# Patient Record
Sex: Male | Born: 1950 | Race: White | Hispanic: No | Marital: Married | Smoking: Never smoker
Health system: Southern US, Community
[De-identification: ages and names within clinical notes are randomized; demographics above are authoritative.]

## PROBLEM LIST (undated history)

## (undated) DIAGNOSIS — A4902 Methicillin resistant Staphylococcus aureus infection, unspecified site: Secondary | ICD-10-CM

## (undated) DIAGNOSIS — G4730 Sleep apnea, unspecified: Secondary | ICD-10-CM

## (undated) DIAGNOSIS — R112 Nausea with vomiting, unspecified: Secondary | ICD-10-CM

## (undated) DIAGNOSIS — E785 Hyperlipidemia, unspecified: Secondary | ICD-10-CM

## (undated) DIAGNOSIS — Z9889 Other specified postprocedural states: Secondary | ICD-10-CM

## (undated) DIAGNOSIS — D759 Disease of blood and blood-forming organs, unspecified: Secondary | ICD-10-CM

## (undated) DIAGNOSIS — I10 Essential (primary) hypertension: Secondary | ICD-10-CM

## (undated) DIAGNOSIS — E119 Type 2 diabetes mellitus without complications: Secondary | ICD-10-CM

## (undated) DIAGNOSIS — G90529 Complex regional pain syndrome I of unspecified lower limb: Secondary | ICD-10-CM

## (undated) DIAGNOSIS — M1990 Unspecified osteoarthritis, unspecified site: Secondary | ICD-10-CM

## (undated) DIAGNOSIS — G459 Transient cerebral ischemic attack, unspecified: Secondary | ICD-10-CM

## (undated) DIAGNOSIS — T8859XA Other complications of anesthesia, initial encounter: Secondary | ICD-10-CM

## (undated) DIAGNOSIS — T4145XA Adverse effect of unspecified anesthetic, initial encounter: Secondary | ICD-10-CM

## (undated) HISTORY — PX: BACK SURGERY: SHX140

## (undated) HISTORY — PX: CARPAL TUNNEL RELEASE: SHX101

## (undated) HISTORY — PX: CHOLECYSTECTOMY: SHX55

## (undated) HISTORY — PX: EYE SURGERY: SHX253

## (undated) HISTORY — PX: OTHER SURGICAL HISTORY: SHX169

## (undated) HISTORY — PX: NECK SURGERY: SHX720

---

## 2014-01-22 ENCOUNTER — Emergency Department (HOSPITAL_COMMUNITY): Payer: Medicare HMO

## 2014-01-22 ENCOUNTER — Emergency Department (HOSPITAL_COMMUNITY)
Admission: EM | Admit: 2014-01-22 | Discharge: 2014-01-22 | Disposition: A | Discharge status: Home / Self Care | Payer: Medicare HMO | Attending: Emergency Medicine | Admitting: Emergency Medicine

## 2014-01-22 ENCOUNTER — Encounter (HOSPITAL_COMMUNITY): Payer: Self-pay | Admitting: Emergency Medicine

## 2014-01-22 DIAGNOSIS — J069 Acute upper respiratory infection, unspecified: Secondary | ICD-10-CM

## 2014-01-22 DIAGNOSIS — R51 Headache: Secondary | ICD-10-CM | POA: Insufficient documentation

## 2014-01-22 DIAGNOSIS — R630 Anorexia: Secondary | ICD-10-CM | POA: Insufficient documentation

## 2014-01-22 DIAGNOSIS — E119 Type 2 diabetes mellitus without complications: Secondary | ICD-10-CM | POA: Insufficient documentation

## 2014-01-22 DIAGNOSIS — I10 Essential (primary) hypertension: Secondary | ICD-10-CM | POA: Insufficient documentation

## 2014-01-22 DIAGNOSIS — Z8614 Personal history of Methicillin resistant Staphylococcus aureus infection: Secondary | ICD-10-CM | POA: Insufficient documentation

## 2014-01-22 DIAGNOSIS — M549 Dorsalgia, unspecified: Secondary | ICD-10-CM | POA: Insufficient documentation

## 2014-01-22 DIAGNOSIS — J029 Acute pharyngitis, unspecified: Secondary | ICD-10-CM | POA: Insufficient documentation

## 2014-01-22 HISTORY — DX: Hyperlipidemia, unspecified: E78.5

## 2014-01-22 HISTORY — DX: Type 2 diabetes mellitus without complications: E11.9

## 2014-01-22 HISTORY — DX: Methicillin resistant Staphylococcus aureus infection, unspecified site: A49.02

## 2014-01-22 HISTORY — DX: Essential (primary) hypertension: I10

## 2014-01-22 MED ORDER — HYDROCOD POLST-CHLORPHEN POLST 10-8 MG/5ML PO LQCR: 5.0000 mL | Freq: Two times a day (BID) | ORAL | Status: DC | PRN

## 2014-01-22 MED ORDER — PREDNISONE 10 MG PO TABS: ORAL_TABLET | ORAL | Status: DC

## 2014-01-22 MED ORDER — OSELTAMIVIR PHOSPHATE 75 MG PO CAPS: 75.0000 mg | ORAL_CAPSULE | Freq: Two times a day (BID) | ORAL | Status: DC

## 2014-01-22 NOTE — Discharge Instructions (Signed)
Your chest x-ray is negative for any acute problems. Your examination and history are consistent with upper respiratory infection. This may be a derivative of influenza. Please wash hands frequently. Please increase fluids. Please use your mask until symptoms have resolved. Please use Tamiflu 2 times daily and prednisone taper as directed until all taken. Please use Tussionex for cough. This medication may cause drowsiness, please use with caution. Please return if any unusual fever, coughing up blood, difficulty breathing, or deterioration in your condition. Upper Respiratory Infection, Adult An upper respiratory infection (URI) is also sometimes known as the common cold. The upper respiratory tract includes the nose, sinuses, throat, trachea, and bronchi. Bronchi are the airways leading to the lungs. Most people improve within 1 week, but symptoms can last up to 2 weeks. A residual cough may last even longer.  CAUSES Many different viruses can infect the tissues lining the upper respiratory tract. The tissues become irritated and inflamed and often become very moist. Mucus production is also common. A cold is contagious. You can easily spread the virus to others by oral contact. This includes kissing, sharing a glass, coughing, or sneezing. Touching your mouth or nose and then touching a surface, which is then touched by another person, can also spread the virus. SYMPTOMS  Symptoms typically develop 1 to 3 days after you come in contact with a cold virus. Symptoms vary from person to person. They may include:  Runny nose.  Sneezing.  Nasal congestion.  Sinus irritation.  Sore throat.  Loss of voice (laryngitis).  Cough.  Fatigue.  Muscle aches.  Loss of appetite.  Headache.  Low-grade fever. DIAGNOSIS  You might diagnose your own cold based on familiar symptoms, since most people get a cold 2 to 3 times a year. Your caregiver can confirm this based on your exam. Most importantly,  your caregiver can check that your symptoms are not due to another disease such as strep throat, sinusitis, pneumonia, asthma, or epiglottitis. Blood tests, throat tests, and X-rays are not necessary to diagnose a common cold, but they may sometimes be helpful in excluding other more serious diseases. Your caregiver will decide if any further tests are required. RISKS AND COMPLICATIONS  You may be at risk for a more severe case of the common cold if you smoke cigarettes, have chronic heart disease (such as heart failure) or lung disease (such as asthma), or if you have a weakened immune system. The very young and very old are also at risk for more serious infections. Bacterial sinusitis, middle ear infections, and bacterial pneumonia can complicate the common cold. The common cold can worsen asthma and chronic obstructive pulmonary disease (COPD). Sometimes, these complications can require emergency medical care and may be life-threatening. PREVENTION  The best way to protect against getting a cold is to practice good hygiene. Avoid oral or hand contact with people with cold symptoms. Wash your hands often if contact occurs. There is no clear evidence that vitamin C, vitamin E, echinacea, or exercise reduces the chance of developing a cold. However, it is always recommended to get plenty of rest and practice good nutrition. TREATMENT  Treatment is directed at relieving symptoms. There is no cure. Antibiotics are not effective, because the infection is caused by a virus, not by bacteria. Treatment may include:  Increased fluid intake. Sports drinks offer valuable electrolytes, sugars, and fluids.  Breathing heated mist or steam (vaporizer or shower).  Eating chicken soup or other clear broths, and maintaining good nutrition.  Getting plenty of rest.  Using gargles or lozenges for comfort.  Controlling fevers with ibuprofen or acetaminophen as directed by your caregiver.  Increasing usage of your  inhaler if you have asthma. Zinc gel and zinc lozenges, taken in the first 24 hours of the common cold, can shorten the duration and lessen the severity of symptoms. Pain medicines may help with fever, muscle aches, and throat pain. A variety of non-prescription medicines are available to treat congestion and runny nose. Your caregiver can make recommendations and may suggest nasal or lung inhalers for other symptoms.  HOME CARE INSTRUCTIONS   Only take over-the-counter or prescription medicines for pain, discomfort, or fever as directed by your caregiver.  Use a warm mist humidifier or inhale steam from a shower to increase air moisture. This may keep secretions moist and make it easier to breathe.  Drink enough water and fluids to keep your urine clear or pale yellow.  Rest as needed.  Return to work when your temperature has returned to normal or as your caregiver advises. You may need to stay home longer to avoid infecting others. You can also use a face mask and careful hand washing to prevent spread of the virus. SEEK MEDICAL CARE IF:   After the first few days, you feel you are getting worse rather than better.  You need your caregiver's advice about medicines to control symptoms.  You develop chills, worsening shortness of breath, or brown or red sputum. These may be signs of pneumonia.  You develop yellow or brown nasal discharge or pain in the face, especially when you bend forward. These may be signs of sinusitis.  You develop a fever, swollen neck glands, pain with swallowing, or white areas in the back of your throat. These may be signs of strep throat. SEEK IMMEDIATE MEDICAL CARE IF:   You have a fever.  You develop severe or persistent headache, ear pain, sinus pain, or chest pain.  You develop wheezing, a prolonged cough, cough up blood, or have a change in your usual mucus (if you have chronic lung disease).  You develop sore muscles or a stiff neck. Document  Released: 06/08/2001 Document Revised: 03/06/2012 Document Reviewed: 04/16/2011 [facility] Patient Information 2014 [place], [place].

## 2014-01-22 NOTE — ED Provider Notes (Signed)
CSN: [id number]     Arrival date & time 01/22/14  0749 History   First MD Initiated Contact with Patient 01/22/14 [phone]     Chief Complaint  Patient presents with  . flu sx    (Consider location/radiation/quality/duration/timing/severity/associated sxs/prior Treatment) Patient is a 63 y.o. male presenting with flu symptoms. The history is provided by the patient.  Influenza Presenting symptoms: cough, headache, rhinorrhea and sore throat   Presenting symptoms: no diarrhea, no nausea, no shortness of breath and no vomiting   Severity:  Moderate Onset quality:  Sudden Duration:  1 day Progression:  Worsening Chronicity:  New Relieved by:  Nothing Ineffective treatments:  Certain positions and OTC medications Associated symptoms: decreased appetite and decreased physical activity   Associated symptoms: no chills   Risk factors: diabetes     Past Medical History  Diagnosis Date  . Diabetes mellitus without complication   . Hypertension   . Hyperlipidemia   . MRSA (methicillin resistant Staphylococcus aureus)    Past Surgical History  Procedure Laterality Date  . Back surgery    . Right shoulder sugery      3 surgeries and shoulder replacement  . Carpal tunnel release Right   . Left middle finger surgery    . Left pinky surgery    . Neck surgery    . Cholecystectomy    . Eye surgery     History reviewed. No pertinent family history. History  Substance Use Topics  . Smoking status: Never Smoker   . Smokeless tobacco: Not on file  . Alcohol Use: Yes    Review of Systems  Constitutional: Positive for decreased appetite. Negative for chills and activity change.       All ROS Neg except as noted in HPI  HENT: Positive for rhinorrhea and sore throat. Negative for nosebleeds.   Eyes: Negative for photophobia and discharge.  Respiratory: Positive for cough. Negative for shortness of breath and wheezing.   Cardiovascular: Negative for chest pain and palpitations.   Gastrointestinal: Negative for nausea, vomiting, abdominal pain, diarrhea and blood in stool.  Genitourinary: Negative for dysuria, frequency and hematuria.  Musculoskeletal: Positive for back pain. Negative for arthralgias and neck pain.  Skin: Negative.   Neurological: Positive for headaches. Negative for dizziness, seizures and speech difficulty.  Psychiatric/Behavioral: Negative for hallucinations and confusion.    Allergies  Review of patient's allergies indicates not on file.  Home Medications  No current outpatient prescriptions on file. BP 120/77  Pulse 89  Temp(Src) 98.5 F (36.9 C) (Oral)  Resp 16  Ht 5\' 11"  (1.803 m)  Wt 265 lb (120.203 kg)  BMI 36.98 kg/m2  SpO2 94% Physical Exam  Nursing note and vitals reviewed. Constitutional: He is oriented to person, place, and time. He appears well-developed and well-nourished.  Non-toxic appearance.  HENT:  Head: Normocephalic.  Right Ear: Tympanic membrane and external ear normal.  Left Ear: Tympanic membrane and external ear normal.  Nasal congestion  Eyes: EOM and lids are normal. Pupils are equal, round, and reactive to light.  Neck: Normal range of motion. Neck supple. Carotid bruit is not present.  Cardiovascular: Normal rate, regular rhythm, normal heart sounds, intact distal pulses and normal pulses.   Pulmonary/Chest: Breath sounds normal. No respiratory distress.  Pt speaks in complete sentences. Symmetrical rise and fall of the chest. Rhonchi present. No wheezes.  Abdominal: Soft. Bowel sounds are normal. There is no tenderness. There is no guarding.  Musculoskeletal: Normal range of motion.  Lymphadenopathy:  Head (right side): No submandibular adenopathy present.       Head (left side): No submandibular adenopathy present.    He has no cervical adenopathy.  Neurological: He is alert and oriented to person, place, and time. He has normal strength. No cranial nerve deficit or sensory deficit. He exhibits  normal muscle tone. Coordination normal.  Skin: Skin is warm and dry.  Psychiatric: He has a normal mood and affect. His speech is normal.    ED Course  Procedures (including critical care time) Labs Review Labs Reviewed - No data to display Imaging Review No results found.  EKG Interpretation   None       MDM  No diagnosis found. *I have reviewed nursing notes, vital signs, and all appropriate lab and imaging results for this patient.**  Vital signs are well within normal limits. Pulse oximetry is 94% on room air. Within normal limits by my interpretation. Chest x-ray is negative for pneumonia or other acute changes. Suspect that the patient has an upper respiratory infection, and or derivative of influenza.  Plan at this time is for the patient to be treated with Tamiflu, prednisone taper, and Tussionex. I have discussed with patient importance of monitoring his diabetes closely while on the prednisone. Also discussed with the patient the need to return if any unusual high fever, hemoptysis, difficulty breathing, or deterioration in his condition.  Kathie DikeHobson M Kaedyn Belardo, PA-C 01/22/14 [phone]

## 2014-01-22 NOTE — ED Notes (Signed)
Pt. Reports cough, congestion, and generalized weakness starting last night. Pt. Denies nausea/vomiting/diarrhea.

## 2014-01-23 NOTE — ED Provider Notes (Signed)
Medical screening examination/treatment/procedure(s) were performed by non-physician practitioner and as supervising physician I was immediately available for consultation/collaboration.  EKG Interpretation   None         Laray AngerKathleen M Pariss Hommes, DO 01/23/14 1737

## 2014-02-18 ENCOUNTER — Encounter: Payer: Self-pay | Admitting: Orthopedic Surgery

## 2014-02-18 ENCOUNTER — Ambulatory Visit (INDEPENDENT_AMBULATORY_CARE_PROVIDER_SITE_OTHER): Payer: Medicare HMO | Admitting: Orthopedic Surgery

## 2014-02-18 ENCOUNTER — Ambulatory Visit (INDEPENDENT_AMBULATORY_CARE_PROVIDER_SITE_OTHER): Payer: Medicare HMO

## 2014-02-18 VITALS — BP 119/73 | Ht 71.0 in | Wt 270.0 lb

## 2014-02-18 DIAGNOSIS — M1990 Unspecified osteoarthritis, unspecified site: Secondary | ICD-10-CM

## 2014-02-18 DIAGNOSIS — M129 Arthropathy, unspecified: Secondary | ICD-10-CM

## 2014-02-18 DIAGNOSIS — M25569 Pain in unspecified knee: Secondary | ICD-10-CM

## 2014-02-18 DIAGNOSIS — M25562 Pain in left knee: Secondary | ICD-10-CM

## 2014-02-18 MED ORDER — NABUMETONE 500 MG PO TABS: 500.0000 mg | ORAL_TABLET | Freq: Two times a day (BID) | ORAL | Status: AC

## 2014-02-18 NOTE — Progress Notes (Signed)
  Subjective:    Joel CrosbySteve A Haegele is a 63 y.o. male 9 year status post arthroscopy left knee twisted his knee a few weeks back and presents with pain and swelling catching locking and giving way  The following portions of the patient's history were reviewed and updated as appropriate: allergies, current medications, past family history, past medical history, past social history, past surgical history and problem list.   Review of Systems Pertinent items are noted in HPI.   Objective:    BP 119/73  Ht 5\' 11"  (1.803 m)  Wt 270 lb (122.471 kg)  BMI 37.67 kg/m2 General appearance is normal, the patient is alert and oriented x3 with normal mood and affect.  Right knee: normal and no effusion, full active range of motion, no joint line tenderness, ligamentous structures intact.  Left knee:  positive exam findings: effusion, medial joint line tenderness, tenderness noted medial, Joint line and over the medial femoral condyle as well as the medial patellar facet, positive McMurray and ROM limited to approximately 130 degrees, negative exam findings: no erythema, ACL stable, PCL stable, MCL stable, LCL stable and no patellar laxity and No other abnormal findings distal neurovascular exam intact angulation slight limp skin warm dry and intact without rash lesion or ulceration    X-ray Left knee: shows DJD changes, likely chronic and Medial joint space narrowing with patellofemoral joint space narrowing of the medial side osteophytes are noted   3 views of the knee show that there is medial joint space narrowing and patellofemoral narrowing medially there is a small joint effusion. We do see small osteophytes as well.  Impression osteoarthritis of the patellofemoral joint and the medial compartment. 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  Verbal consent was obtained, timeout was completed.  Sterile prep of the LEFT knee.  Lateral approach, suprapatellar  Aspiration of 20 cc of clear, yellow fluid.  Injection of Depo-Medrol 40 mg per cc, 1 cc, and 4 cc 1% plain lidocaine.  No complications.

## 2014-02-18 NOTE — Patient Instructions (Addendum)
[facility] [place]  Joint Injection  Care After  Refer to this sheet in the next few days. These instructions provide you with information on caring for yourself after you have had a joint injection. Your caregiver also may give you more specific instructions. Your treatment has been planned according to current medical practices, but problems sometimes occur. Call your caregiver if you have any problems or questions after your procedure.  After any type of joint injection, it is not uncommon to experience:  Soreness, swelling, or bruising around the injection site.  Mild numbness, tingling, or weakness around the injection site caused by the numbing medicine used before or with the injection. It also is possible to experience the following effects associated with the specific agent after injection:  Iodine-based contrast agents:  Allergic reaction (itching, hives, widespread redness, and swelling beyond the injection site).  Corticosteroids (These effects are rare.):  Allergic reaction.  Increased blood sugar levels (If you have diabetes and you notice that your blood sugar levels have increased, notify your caregiver).  Increased blood pressure levels.  Mood swings.  Hyaluronic acid in the use of viscosupplementation.  Temporary heat or redness.  Temporary rash and itching.  Increased fluid accumulation in the injected joint. These effects all should resolve within a day after your procedure.  HOME CARE INSTRUCTIONS  Limit yourself to light activity the day of your procedure. Avoid lifting heavy objects, bending, stooping, or twisting.  Take prescription or over-the-counter pain medication as directed by your caregiver.  You may apply ice to your injection site to reduce pain and swelling the day of your procedure. Ice may be applied 3-4 times:  Put ice in a plastic bag.  Place a towel between your skin and the bag.  Leave the ice on for no longer than 15-20  minutes each time. SEEK IMMEDIATE MEDICAL CARE IF:  Pain and swelling get worse rather than better or extend beyond the injection site.  Numbness does not go away.  Blood or fluid continues to leak from the injection site.  You have chest pain.  You have swelling of your face or tongue.  You have trouble breathing or you become dizzy.  You develop a fever, chills, or severe tenderness at the injection site that last longer than 1 day. MAKE SURE YOU:  Understand these instructions.  Watch your condition.  Get help right away if you are not doing well or if you get worse. Document Released: 08/26/2011 Document Revised: 03/06/2012 Document Reviewed: 08/26/2011  [facility] Patient Information 2014 [place]

## 2014-03-13 ENCOUNTER — Telehealth: Payer: Self-pay | Admitting: Orthopedic Surgery

## 2014-03-13 NOTE — Telephone Encounter (Signed)
Come in Monday for preop   Schedule for wed and tell OR he needs preop mon

## 2014-03-13 NOTE — Telephone Encounter (Signed)
Lurlean LeydenSteve Ethridge asked for you to do the knee surgery.  Said he was not home long enough to have it done while there, and his doctor there wants you to do it here. He will be here for 2 months at least and wants to get it done while here.  Please advise  His # [phone]

## 2014-03-14 NOTE — Telephone Encounter (Signed)
Scheduled for an pre-op appointment Monday 03/18/14.

## 2014-03-18 ENCOUNTER — Encounter (HOSPITAL_COMMUNITY)
Admission: RE | Admit: 2014-03-18 | Discharge: 2014-03-18 | Disposition: A | Discharge status: Home / Self Care | Payer: Medicare HMO | Source: Ambulatory Visit | Attending: Orthopedic Surgery | Admitting: Orthopedic Surgery

## 2014-03-18 ENCOUNTER — Ambulatory Visit (INDEPENDENT_AMBULATORY_CARE_PROVIDER_SITE_OTHER): Payer: Medicare HMO | Admitting: Orthopedic Surgery

## 2014-03-18 ENCOUNTER — Telehealth: Payer: Self-pay | Admitting: Orthopedic Surgery

## 2014-03-18 ENCOUNTER — Encounter (HOSPITAL_COMMUNITY): Payer: Self-pay

## 2014-03-18 ENCOUNTER — Encounter (HOSPITAL_COMMUNITY): Payer: Self-pay | Admitting: Pharmacy Technician

## 2014-03-18 VITALS — BP 122/82 | Ht 71.0 in | Wt 270.0 lb

## 2014-03-18 DIAGNOSIS — M179 Osteoarthritis of knee, unspecified: Secondary | ICD-10-CM

## 2014-03-18 DIAGNOSIS — M23329 Other meniscus derangements, posterior horn of medial meniscus, unspecified knee: Secondary | ICD-10-CM | POA: Insufficient documentation

## 2014-03-18 DIAGNOSIS — M1710 Unilateral primary osteoarthritis, unspecified knee: Secondary | ICD-10-CM

## 2014-03-18 DIAGNOSIS — IMO0002 Reserved for concepts with insufficient information to code with codable children: Secondary | ICD-10-CM

## 2014-03-18 HISTORY — DX: Nausea with vomiting, unspecified: R11.2

## 2014-03-18 HISTORY — DX: Disease of blood and blood-forming organs, unspecified: D75.9

## 2014-03-18 HISTORY — DX: Unspecified osteoarthritis, unspecified site: M19.90

## 2014-03-18 HISTORY — DX: Complex regional pain syndrome i of unspecified lower limb: G90.529

## 2014-03-18 HISTORY — DX: Other complications of anesthesia, initial encounter: T88.59XA

## 2014-03-18 HISTORY — DX: Adverse effect of unspecified anesthetic, initial encounter: T41.45XA

## 2014-03-18 HISTORY — DX: Other specified postprocedural states: Z98.890

## 2014-03-18 HISTORY — DX: Transient cerebral ischemic attack, unspecified: G45.9

## 2014-03-18 HISTORY — DX: Sleep apnea, unspecified: G47.30

## 2014-03-18 LAB — BASIC METABOLIC PANEL
BUN: 21 mg/dL (ref 6–23)
CALCIUM: 9.6 mg/dL (ref 8.4–10.5)
CO2: 29 mEq/L (ref 19–32)
Chloride: 101 mEq/L (ref 96–112)
Creatinine, Ser: 1.19 mg/dL (ref 0.50–1.35)
GFR calc non Af Amer: 64 mL/min — ABNORMAL LOW (ref >90)
GFR, EST AFRICAN AMERICAN: 74 mL/min — AB (ref >90)
Glucose, Bld: 167 mg/dL — ABNORMAL HIGH (ref 70–99)
Potassium: 3.9 mEq/L (ref 3.7–5.3)
Sodium: 141 mEq/L (ref 137–147)

## 2014-03-18 LAB — CBC WITH DIFFERENTIAL/PLATELET
Basophils Absolute: 0 10*3/uL (ref 0.0–0.1)
Basophils Relative: 1 % (ref 0–1)
EOS ABS: 0.3 10*3/uL (ref 0.0–0.7)
Eosinophils Relative: 4 % (ref 0–5)
HCT: 41.3 % (ref 39.0–52.0)
Hemoglobin: 14.4 g/dL (ref 13.0–17.0)
LYMPHS ABS: 2.4 10*3/uL (ref 0.7–4.0)
Lymphocytes Relative: 32 % (ref 12–46)
MCH: 30.3 pg (ref 26.0–34.0)
MCHC: 34.9 g/dL (ref 30.0–36.0)
MCV: 86.9 fL (ref 78.0–100.0)
Monocytes Absolute: 0.6 10*3/uL (ref 0.1–1.0)
Monocytes Relative: 9 % (ref 3–12)
NEUTROS PCT: 55 % (ref 43–77)
Neutro Abs: 4.1 10*3/uL (ref 1.7–7.7)
PLATELETS: 183 10*3/uL (ref 150–400)
RBC: 4.75 MIL/uL (ref 4.22–5.81)
RDW: 13.6 % (ref 11.5–15.5)
WBC: 7.4 10*3/uL (ref 4.0–10.5)

## 2014-03-18 NOTE — Pre-Procedure Instructions (Signed)
Dr Jayme CloudGonzalez aware of extensive medical history and no further orders given.

## 2014-03-18 NOTE — Pre-Procedure Instructions (Signed)
Patient given information to sign up for my chart at home. 

## 2014-03-18 NOTE — Patient Instructions (Addendum)
Knee surgery   .CONSENT  Meniscus Injury, Arthroscopy Arthroscopy is a surgical procedure that involves the use of a small scope that has a camera and surgical instruments on the end (arthroscope). An arthroscope can be used to repair your meniscus injury.  LET [facility] KNOW ABOUT:  Any allergies you have.  All medicines you are taking, including vitamins, herbs, eyedrops, creams, and over-the-counter medicines.  Any recent colds or infections you have had or currently have.  Previous problems you or members of your family have had with the use of anesthetics.  Any blood disorders or blood clotting problems you have.  Previous surgeries you have had.  Medical conditions you have. RISKS AND COMPLICATIONS Generally, this is a safe procedure. However, as with any procedure, complications can occur. Possible complications include:  Damage to nerves or blood vessels.  Excess bleeding.  Blood clots.  Infection. BEFORE THE PROCEDURE  Do not eat or drink for 6 8 hours before the procedure.  Take medicines as directed by your surgeon. Ask your surgeon about changing or stopping your regular medicines.  You may have lab tests the morning of surgery. PROCEDURE  You will be given one of the following:   A medicine that numbs the area (local anesthesia).  A medicine that makes you go to sleep (general anesthesia).  A medicine injected into your spine that numbs your body below the waist (spinal anesthesia). Most often, several small cuts (incisions) are made in the knee. The arthroscope and instruments go into the incisions to repair the damage. The torn portion of the meniscus are removed.  During this time, your surgeon may find a partial or complete tear in a cruciate ligament, such as the anterior cruciate ligament (ACL). A completely torn cruciate ligament is reconstructed by taking tissue from another part of the body (grafting) and placing it into the injured  area. This requires several larger incisions to complete the repair. Sometimes, open surgery is needed for collateral ligament injuries. If a collateral ligament is found to be injured, your surgeon may staple or suture the tear through a slightly larger incision on the side of the knee. AFTER THE PROCEDURE You will be taken to the recovery area where your progress will be monitored. When you are awake, stable, and taking fluids without complications, you will be allowed to go home. This is usually the same day. However, more extensive repairs of a ligament may require an overnight stay.  The recovery time after repairing your meniscus or ligament depends on the amount of damage to these structures. It also depends on whether or not reconstructive knee surgery was needed.   A torn or stretched ligament (ligament sprain) may take 6 8 weeks to heal. It takes about the same amount of time if your surgeon removed a torn meniscus.  A repaired meniscus may require 6 12 weeks of recovery time.  A torn ligament needing reconstructive surgery may take 6 12 months to heal fully. Document Released: 12/10/2000 Document Revised: 08/15/2013 Document Reviewed: 05/11/2013 [employer] Patient Information 2014 [employer].   YOU SHOULD EXPECT 80-95% IMPROVEMENT IN YOUR KNEE. IF YOU HAVE A LOT OF ARTHRITIS IN YOUR KNEE YOU WILL MOST LIKELY NEED KNEE REPLACEMENT SURGERY

## 2014-03-18 NOTE — Patient Instructions (Signed)
Joel CrosbySteve A Quinn  03/18/2014   Your procedure is scheduled on:  03/20/2014  [facility] to [facility] at  950  AM.  Call this number if you have problems the morning of surgery: [phone]   Remember:   Do not eat food or drink liquids after midnight.   Take these medicines the morning of surgery with A SIP OF WATER: d8iltiazem, pepcid, relafen, prednisone, flomax   Do not wear jewelry, make-up or nail polish.  Do not wear lotions, powders, or perfumes.   Do not shave 48 hours prior to surgery. Men may shave face and neck.  Do not bring valuables to the hospital.  [facility] is not responsible for any belongings or valuables.               Contacts, dentures or bridgework may not be worn into surgery.  Leave suitcase in the car. After surgery it may be brought to your room.  For patients admitted to the hospital, discharge time is determined by your  treatment team.               Patients discharged the day of surgery will not be allowed to drive home.  Name and phone number of your driver: family  Special Instructions: Shower using CHG 2 nights before surgery and the night before surgery.  If you shower the day of surgery use CHG.  Use special wash - you have one bottle of CHG for all showers.  You should use approximately 1/3 of the bottle for each shower.   Please read over the following fact sheets that you were given: Pain Booklet, Coughing and Deep Breathing and Surgical Site Infection Prevention Arthroscopic Procedure, Knee An arthroscopic procedure can find what is wrong with your knee. PROCEDURE Arthroscopy is a surgical technique that allows your orthopedic surgeon to diagnose and treat your knee injury with accuracy. They will look into your knee through a small instrument. This is almost like a small (pencil sized) telescope. Because arthroscopy affects your knee less than open knee surgery, you can anticipate a more rapid recovery. Taking an active role by following your  caregiver's instructions will help with rapid and complete recovery. Use crutches, rest, elevation, ice, and knee exercises as instructed. The length of recovery depends on various factors including type of injury, age, physical condition, medical conditions, and your rehabilitation. Your knee is the joint between the large bones (femur and tibia) in your leg. Cartilage covers these bone ends which are smooth and slippery and allow your knee to bend and move smoothly. Two menisci, thick, semi-lunar shaped pads of cartilage which form a rim inside the joint, help absorb shock and stabilize your knee. Ligaments bind the bones together and support your knee joint. Muscles move the joint, help support your knee, and take stress off the joint itself. Because of this all programs and physical therapy to rehabilitate an injured or repaired knee require rebuilding and strengthening your muscles. AFTER THE PROCEDURE  After the procedure, you will be moved to a recovery area until most of the effects of the medication have worn off. Your caregiver will discuss the test results with you.  Only take over-the-counter or prescription medicines for pain, discomfort, or fever as directed by your caregiver. SEEK MEDICAL CARE IF:   You have increased bleeding from your wounds.  You see redness, swelling, or have increasing pain in your wounds.  You have pus coming from your wound.  You have  an oral temperature above 102 F (38.9 C).  You notice a bad smell coming from the wound or dressing.  You have severe pain with any motion of your knee. SEEK IMMEDIATE MEDICAL CARE IF:   You develop a rash.  You have difficulty breathing.  You have any allergic problems. Document Released: 12/10/2000 Document Revised: 03/06/2012 Document Reviewed: 07/03/2008 [facility] Patient Information 2014 [place], [place]. PATIENT INSTRUCTIONS POST-ANESTHESIA  IMMEDIATELY FOLLOWING SURGERY:  Do not drive or operate machinery for  the first twenty four hours after surgery.  Do not make any important decisions for twenty four hours after surgery or while taking narcotic pain medications or sedatives.  If you develop intractable nausea and vomiting or a severe headache please notify your doctor immediately.  FOLLOW-UP:  Please make an appointment with your surgeon as instructed. You do not need to follow up with anesthesia unless specifically instructed to do so.  WOUND CARE INSTRUCTIONS (if applicable):  Keep a dry clean dressing on the anesthesia/puncture wound site if there is drainage.  Once the wound has quit draining you may leave it open to air.  Generally you should leave the bandage intact for twenty four hours unless there is drainage.  If the epidural site drains for more than 36-48 hours please call the anesthesia department.  QUESTIONS?:  Please feel free to call your physician or the hospital operator if you have any questions, and they will be happy to assist you.

## 2014-03-18 NOTE — Telephone Encounter (Signed)
Regarding out-patient surgery scheduled 03/20/14 at [facility], out-patient, CPT [id number], [phone], contacted insurer, [employer], Ph #[phone];  Per automated voice response system and fax received, no pre-authorization is required for these procedures for out-patient; also spoke with PalauLucia C, who has verified no pre-auth required, however, subject to review at time of receiving claim.  Reference # AVA [id number] (per fax confirmation) and Call Reference [phone].

## 2014-03-18 NOTE — Progress Notes (Signed)
Patient ID: Joel CrosbySteve A Quinn, male   DOB: 1951-11-16, 63 y.o.   MRN: [id number] Chief Complaint  Patient presents with  . Follow-up    Recheck on left knee.    CONTINUE PAIN AND SWELLING WITH GIVING WAY LEFT KNEE   HE WAS GOING BACK HOME FOR SURGERY BUT DECIDED TO HAVE IT HERE   SEE H/P   Encounter Diagnoses  Name Primary?  Marland Kitchen. Arthritis of knee, degenerative Yes  . Medial meniscus, posterior horn derangement

## 2014-03-18 NOTE — H&P (Signed)
Joel CrosbySteve A Gemmer is an 63 y.o. male.   Chief Complaint: LEFT KNE PAIN AND GIVING WAY  HPI:   Subjective:     Joel Quinn is a 63 y.o. male 9 year status post arthroscopy left knee, FREQUENT GIVING OUT EPISODES, THEN HE  twisted his knee a few weeks back and presented with pain and swelling; catching, locking, and giving way. I gave him an injection but he continues to have symptoms. [place] to [place] instead of [place]    The following portions of the patient's history were reviewed and updated as appropriate: allergies, current medications, past family history, past medical history, past social history, past surgical history and problem list.   Review of Systems Pertinent items are noted in HPI.    Past Medical History  Diagnosis Date  . Diabetes mellitus without complication   . Hypertension   . Hyperlipidemia   . MRSA (methicillin resistant Staphylococcus aureus)     Past Surgical History  Procedure Laterality Date  . Back surgery    . Right shoulder sugery      3 surgeries and shoulder replacement  . Carpal tunnel release Right   . Left middle finger surgery    . Left pinky surgery    . Neck surgery    . Cholecystectomy    . Eye surgery     Current outpatient prescriptions:aspirin 81 MG tablet, Take 81 mg by mouth daily., Disp: , Rfl: ;  chlorpheniramine-HYDROcodone (TUSSIONEX PENNKINETIC ER) 10-8 MG/5ML LQCR, Take 5 mLs by mouth every 12 (twelve) hours as needed for cough., Disp: 100 mL, Rfl: 0;  diltiazem (TIAZAC) 240 MG 24 hr capsule, Take 240 mg by mouth daily., Disp: , Rfl: ;  famotidine (PEPCID) 20 MG tablet, Take 20 mg by mouth daily., Disp: , Rfl:  folic acid (FOLVITE) 800 MCG tablet, Take 800 mcg by mouth daily., Disp: , Rfl: ;  furosemide (LASIX) 40 MG tablet, Take 40 mg by mouth daily., Disp: , Rfl: ;  glipiZIDE (GLUCOTROL) 5 MG tablet, Take by mouth daily before breakfast., Disp: , Rfl: ;  lamoTRIgine (LAMICTAL) 200 MG tablet, Take 200 mg by mouth daily., Disp:  , Rfl: ;  metFORMIN (GLUCOPHAGE) 500 MG tablet, Take by mouth 2 (two) times daily with a meal., Disp: , Rfl:  nabumetone (RELAFEN) 500 MG tablet, Take 1 tablet (500 mg total) by mouth 2 (two) times daily., Disp: 60 tablet, Rfl: 5;  oseltamivir (TAMIFLU) 75 MG capsule, Take 1 capsule (75 mg total) by mouth every 12 (twelve) hours., Disp: 10 capsule, Rfl: 0;  Potassium Gluconate 595 MG CAPS, Take by mouth daily., Disp: , Rfl: ;  predniSONE (DELTASONE) 10 MG tablet, 5,4,3,2,1 - take with food, Disp: 15 tablet, Rfl: 0 simvastatin (ZOCOR) 20 MG tablet, Take 20 mg by mouth daily., Disp: , Rfl: ;  tamsulosin (FLOMAX) 0.4 MG CAPS capsule, Take 0.4 mg by mouth., Disp: , Rfl:  No family history on file. Social History:  reports that he has never smoked. He does not have any smokeless tobacco history on file. He reports that he drinks alcohol. His drug history is not on file.  Allergies: No Known Allergies   (Not in a hospital admission)  No results found for this or any previous visit (from the past 48 hour(s)). No results found.  ROS SEE ABOVE   Blood pressure 122/82, height 5\' 11"  (1.803 m), weight 270 lb (122.471 kg). Physical Exam    Objective:     BP  119/73  Ht 5\' 11"  (1.803 m)  Wt 270 lb (122.471 kg)  BMI 37.67 kg/m2 General appearance is normal, the patient is alert and oriented x3 with normal mood and affect.    Right knee:  normal and no effusion, full active range of motion, no joint line tenderness, ligamentous structures intact.   Left knee:   positive exam findings: effusion, medial joint line tenderness, tenderness noted medial, Joint line and over the medial femoral condyle as well as the medial patellar facet, positive McMurray and ROM limited to approximately 130 degrees, negative exam findings: no erythema, ACL stable, PCL stable, MCL stable, LCL stable and no patellar laxity and No other abnormal findings distal neurovascular exam intact angulation slight limp skin warm dry and  intact without rash lesion or ulceration     X-ray Left knee: shows DJD changes, likely chronic and Medial joint space narrowing with patellofemoral joint space narrowing of the medial side osteophytes are noted   3 views of the knee show that there is medial joint space narrowing and patellofemoral narrowing medially there is a small joint effusion. We do see small osteophytes as well.  Impression medial meniscus tear, with osteoarthritis of the patellofemoral joint and the medial compartment.  Assessment/Plan ARTHROSCOPY WITH MEDIAL MENISECTOMY   Fuller Canada 03/18/2014, 11:09 AM

## 2014-03-20 ENCOUNTER — Ambulatory Visit (HOSPITAL_COMMUNITY)
Admission: RE | Admit: 2014-03-20 | Discharge: 2014-03-20 | Disposition: A | Discharge status: Home / Self Care | Payer: Medicare HMO | Source: Ambulatory Visit | Attending: Orthopedic Surgery | Admitting: Orthopedic Surgery

## 2014-03-20 ENCOUNTER — Encounter (HOSPITAL_COMMUNITY): Payer: Self-pay | Admitting: *Deleted

## 2014-03-20 ENCOUNTER — Telehealth: Payer: Self-pay | Admitting: Orthopedic Surgery

## 2014-03-20 ENCOUNTER — Ambulatory Visit (HOSPITAL_COMMUNITY): Payer: Medicare HMO | Admitting: Anesthesiology

## 2014-03-20 ENCOUNTER — Encounter (HOSPITAL_COMMUNITY): Payer: Medicare HMO | Admitting: Anesthesiology

## 2014-03-20 ENCOUNTER — Encounter (HOSPITAL_COMMUNITY)
Admission: RE | Disposition: A | Discharge status: Home / Self Care | Payer: Self-pay | Source: Ambulatory Visit | Attending: Orthopedic Surgery

## 2014-03-20 DIAGNOSIS — IMO0002 Reserved for concepts with insufficient information to code with codable children: Secondary | ICD-10-CM

## 2014-03-20 DIAGNOSIS — M2340 Loose body in knee, unspecified knee: Secondary | ICD-10-CM | POA: Insufficient documentation

## 2014-03-20 DIAGNOSIS — M1710 Unilateral primary osteoarthritis, unspecified knee: Secondary | ICD-10-CM

## 2014-03-20 DIAGNOSIS — E119 Type 2 diabetes mellitus without complications: Secondary | ICD-10-CM | POA: Insufficient documentation

## 2014-03-20 DIAGNOSIS — Z8614 Personal history of Methicillin resistant Staphylococcus aureus infection: Secondary | ICD-10-CM | POA: Insufficient documentation

## 2014-03-20 DIAGNOSIS — M659 Unspecified synovitis and tenosynovitis, unspecified site: Secondary | ICD-10-CM | POA: Insufficient documentation

## 2014-03-20 DIAGNOSIS — M2240 Chondromalacia patellae, unspecified knee: Secondary | ICD-10-CM | POA: Insufficient documentation

## 2014-03-20 DIAGNOSIS — X500XXA Overexertion from strenuous movement or load, initial encounter: Secondary | ICD-10-CM | POA: Insufficient documentation

## 2014-03-20 DIAGNOSIS — E785 Hyperlipidemia, unspecified: Secondary | ICD-10-CM | POA: Insufficient documentation

## 2014-03-20 DIAGNOSIS — K219 Gastro-esophageal reflux disease without esophagitis: Secondary | ICD-10-CM | POA: Insufficient documentation

## 2014-03-20 DIAGNOSIS — M179 Osteoarthritis of knee, unspecified: Secondary | ICD-10-CM

## 2014-03-20 DIAGNOSIS — Z79899 Other long term (current) drug therapy: Secondary | ICD-10-CM | POA: Insufficient documentation

## 2014-03-20 DIAGNOSIS — I10 Essential (primary) hypertension: Secondary | ICD-10-CM | POA: Insufficient documentation

## 2014-03-20 DIAGNOSIS — Z7982 Long term (current) use of aspirin: Secondary | ICD-10-CM | POA: Insufficient documentation

## 2014-03-20 HISTORY — PX: KNEE ARTHROSCOPY WITH MEDIAL MENISECTOMY: SHX5651

## 2014-03-20 HISTORY — PX: CHONDROPLASTY: SHX5177

## 2014-03-20 HISTORY — PX: FOREIGN BODY REMOVAL: SHX962

## 2014-03-20 LAB — GLUCOSE, CAPILLARY
GLUCOSE-CAPILLARY: 104 mg/dL — AB (ref 70–99)
Glucose-Capillary: 97 mg/dL (ref 70–99)

## 2014-03-20 SURGERY — ARTHROSCOPY, KNEE, WITH MEDIAL MENISCECTOMY
Anesthesia: General | Site: Knee | Laterality: Left

## 2014-03-20 MED ORDER — ONDANSETRON HCL 4 MG/2ML IJ SOLN
4.0000 mg | Freq: Once | INTRAMUSCULAR | Status: AC
  Administered 2014-03-20: 4 mg via INTRAVENOUS

## 2014-03-20 MED ORDER — CEFAZOLIN SODIUM 1-5 GM-% IV SOLN
1.0000 g | Freq: Once | INTRAVENOUS | Status: AC
  Administered 2014-03-20: 2 g via INTRAVENOUS
  Filled 2014-03-20: qty 50

## 2014-03-20 MED ORDER — LIDOCAINE HCL (CARDIAC) 10 MG/ML IV SOLN
INTRAVENOUS | Status: DC | PRN
  Administered 2014-03-20: 10 mg via INTRAVENOUS

## 2014-03-20 MED ORDER — OXYCODONE-ACETAMINOPHEN 5-325 MG PO TABS: 1.0000 | ORAL_TABLET | ORAL | Status: AC | PRN

## 2014-03-20 MED ORDER — LACTATED RINGERS IV SOLN
INTRAVENOUS | Status: DC
  Administered 2014-03-20: 11:00:00 via INTRAVENOUS

## 2014-03-20 MED ORDER — ONDANSETRON HCL 4 MG/2ML IJ SOLN
INTRAMUSCULAR | Status: AC
  Filled 2014-03-20: qty 2

## 2014-03-20 MED ORDER — KETOROLAC TROMETHAMINE 30 MG/ML IJ SOLN
INTRAMUSCULAR | Status: AC
  Filled 2014-03-20: qty 1

## 2014-03-20 MED ORDER — HYDROCODONE-ACETAMINOPHEN 5-325 MG PO TABS
ORAL_TABLET | ORAL | Status: AC
  Filled 2014-03-20: qty 1

## 2014-03-20 MED ORDER — KETOROLAC TROMETHAMINE 30 MG/ML IJ SOLN
30.0000 mg | Freq: Once | INTRAMUSCULAR | Status: AC
  Administered 2014-03-20: 30 mg via INTRAVENOUS

## 2014-03-20 MED ORDER — BUPIVACAINE-EPINEPHRINE PF 0.5-1:200000 % IJ SOLN
INTRAMUSCULAR | Status: AC
Start: 2014-03-20 — End: 2014-03-20
  Filled 2014-03-20: qty 20

## 2014-03-20 MED ORDER — ONDANSETRON HCL 4 MG/2ML IJ SOLN: 4.0000 mg | Freq: Once | INTRAMUSCULAR | Status: DC | PRN

## 2014-03-20 MED ORDER — FENTANYL CITRATE 0.05 MG/ML IJ SOLN
INTRAMUSCULAR | Status: DC | PRN
  Administered 2014-03-20 (×2): 25 ug via INTRAVENOUS
  Administered 2014-03-20: 50 ug via INTRAVENOUS

## 2014-03-20 MED ORDER — PROPOFOL 10 MG/ML IV BOLUS
INTRAVENOUS | Status: DC | PRN
  Administered 2014-03-20: 200 mg via INTRAVENOUS
  Administered 2014-03-20 (×2): 20 mg via INTRAVENOUS
  Administered 2014-03-20: 30 mg via INTRAVENOUS
  Administered 2014-03-20: 20 mg via INTRAVENOUS

## 2014-03-20 MED ORDER — DEXTROSE 5 % IV SOLN: 3.0000 g | INTRAVENOUS | Status: DC

## 2014-03-20 MED ORDER — CEFAZOLIN SODIUM-DEXTROSE 2-3 GM-% IV SOLR
2.0000 g | Freq: Once | INTRAVENOUS | Status: AC
  Administered 2014-03-20: 1 g via INTRAVENOUS

## 2014-03-20 MED ORDER — PROPOFOL 10 MG/ML IV BOLUS
INTRAVENOUS | Status: AC
  Filled 2014-03-20: qty 20

## 2014-03-20 MED ORDER — FENTANYL CITRATE 0.05 MG/ML IJ SOLN: 25.0000 ug | INTRAMUSCULAR | Status: DC | PRN

## 2014-03-20 MED ORDER — FENTANYL CITRATE 0.05 MG/ML IJ SOLN
INTRAMUSCULAR | Status: AC
  Filled 2014-03-20: qty 2

## 2014-03-20 MED ORDER — PROMETHAZINE HCL 12.5 MG PO TABS: 12.5000 mg | ORAL_TABLET | Freq: Four times a day (QID) | ORAL | Status: AC | PRN

## 2014-03-20 MED ORDER — LIDOCAINE HCL (PF) 1 % IJ SOLN
INTRAMUSCULAR | Status: AC
  Filled 2014-03-20: qty 5

## 2014-03-20 MED ORDER — CEFAZOLIN SODIUM-DEXTROSE 2-3 GM-% IV SOLR
INTRAVENOUS | Status: AC
Start: 2014-03-20 — End: 2014-03-20
  Filled 2014-03-20: qty 50

## 2014-03-20 MED ORDER — EPINEPHRINE HCL 1 MG/ML IJ SOLN
INTRAMUSCULAR | Status: AC
  Filled 2014-03-20: qty 5

## 2014-03-20 MED ORDER — DEXAMETHASONE SODIUM PHOSPHATE 4 MG/ML IJ SOLN
INTRAMUSCULAR | Status: AC
  Filled 2014-03-20: qty 1

## 2014-03-20 MED ORDER — HYDROCODONE-ACETAMINOPHEN 5-325 MG PO TABS
1.0000 | ORAL_TABLET | Freq: Once | ORAL | Status: AC
  Administered 2014-03-20: 1 via ORAL

## 2014-03-20 MED ORDER — DEXAMETHASONE SODIUM PHOSPHATE 4 MG/ML IJ SOLN
4.0000 mg | Freq: Once | INTRAMUSCULAR | Status: AC
  Administered 2014-03-20: 4 mg via INTRAVENOUS

## 2014-03-20 MED ORDER — SODIUM CHLORIDE 0.9 % IR SOLN
Status: DC | PRN
  Administered 2014-03-20: 1000 mL

## 2014-03-20 MED ORDER — MIDAZOLAM HCL 2 MG/2ML IJ SOLN
1.0000 mg | INTRAMUSCULAR | Status: DC | PRN
  Administered 2014-03-20: 2 mg via INTRAVENOUS

## 2014-03-20 MED ORDER — SCOPOLAMINE 1 MG/3DAYS TD PT72
MEDICATED_PATCH | TRANSDERMAL | Status: AC
Start: 2014-03-20 — End: 2014-03-20
  Filled 2014-03-20: qty 1

## 2014-03-20 MED ORDER — CHLORHEXIDINE GLUCONATE 4 % EX LIQD: 60.0000 mL | Freq: Once | CUTANEOUS | Status: DC

## 2014-03-20 MED ORDER — EPINEPHRINE HCL 1 MG/ML IJ SOLN
INTRAMUSCULAR | Status: DC | PRN
  Administered 2014-03-20 (×3)

## 2014-03-20 MED ORDER — GLYCOPYRROLATE 0.2 MG/ML IJ SOLN
0.2000 mg | Freq: Once | INTRAMUSCULAR | Status: AC
  Administered 2014-03-20: 0.2 mg via INTRAVENOUS

## 2014-03-20 MED ORDER — SCOPOLAMINE 1 MG/3DAYS TD PT72
1.0000 | MEDICATED_PATCH | Freq: Once | TRANSDERMAL | Status: DC
  Administered 2014-03-20: 1.5 mg via TRANSDERMAL

## 2014-03-20 MED ORDER — FENTANYL CITRATE 0.05 MG/ML IJ SOLN
25.0000 ug | INTRAMUSCULAR | Status: AC
  Administered 2014-03-20 (×2): 25 ug via INTRAVENOUS
  Filled 2014-03-20: qty 2

## 2014-03-20 MED ORDER — MIDAZOLAM HCL 2 MG/2ML IJ SOLN
INTRAMUSCULAR | Status: AC
  Filled 2014-03-20: qty 2

## 2014-03-20 MED ORDER — GLYCOPYRROLATE 0.2 MG/ML IJ SOLN
INTRAMUSCULAR | Status: AC
  Filled 2014-03-20: qty 1

## 2014-03-20 MED ORDER — BUPIVACAINE-EPINEPHRINE PF 0.5-1:200000 % IJ SOLN
INTRAMUSCULAR | Status: DC | PRN
  Administered 2014-03-20: 60 mL via PERINEURAL

## 2014-03-20 SURGICAL SUPPLY — 52 items
ARTHROWAND PARAGON T2 (SURGICAL WAND)
BAG HAMPER (MISCELLANEOUS) ×2 IMPLANT
BANDAGE ELASTIC 6 VELCRO NS (GAUZE/BANDAGES/DRESSINGS) ×2 IMPLANT
BLADE AGGRESSIVE PLUS 4.0 (BLADE) ×2 IMPLANT
BLADE SURG SZ11 CARB STEEL (BLADE) ×2 IMPLANT
BNDG CONFORM 6X.82 1P STRL (GAUZE/BANDAGES/DRESSINGS) ×2 IMPLANT
CHLORAPREP W/TINT 26ML (MISCELLANEOUS) ×4 IMPLANT
CLOTH BEACON ORANGE TIMEOUT ST (SAFETY) ×2 IMPLANT
COOLER CRYO IC GRAV AND TUBE (ORTHOPEDIC SUPPLIES) ×2 IMPLANT
CUFF CRYO KNEE LG 20X31 COOLER (ORTHOPEDIC SUPPLIES) ×2 IMPLANT
CUFF CRYO KNEE18X23 MED (MISCELLANEOUS) IMPLANT
CUFF TOURNIQUET SINGLE 34IN LL (TOURNIQUET CUFF) ×2 IMPLANT
CUFF TOURNIQUET SINGLE 44IN (TOURNIQUET CUFF) IMPLANT
CUTTER ANGLED DBL BITE 4.5 (BURR) IMPLANT
DECANTER SPIKE VIAL GLASS SM (MISCELLANEOUS) ×4 IMPLANT
GAUZE SPONGE 4X4 16PLY XRAY LF (GAUZE/BANDAGES/DRESSINGS) ×2 IMPLANT
GAUZE XEROFORM 5X9 LF (GAUZE/BANDAGES/DRESSINGS) ×2 IMPLANT
GLOVE BIOGEL PI IND STRL 7.0 (GLOVE) ×2 IMPLANT
GLOVE BIOGEL PI INDICATOR 7.0 (GLOVE) ×2
GLOVE SKINSENSE NS SZ8.0 LF (GLOVE) ×1
GLOVE SKINSENSE STRL SZ8.0 LF (GLOVE) ×1 IMPLANT
GLOVE SS BIOGEL STRL SZ 6.5 (GLOVE) ×1 IMPLANT
GLOVE SS N UNI LF 8.5 STRL (GLOVE) ×2 IMPLANT
GLOVE SUPERSENSE BIOGEL SZ 6.5 (GLOVE) ×1
GOWN STRL REUS W/TWL LRG LVL3 (GOWN DISPOSABLE) ×2 IMPLANT
GOWN STRL REUS W/TWL XL LVL3 (GOWN DISPOSABLE) ×2 IMPLANT
HLDR LEG FOAM (MISCELLANEOUS) ×1 IMPLANT
IV NS IRRIG 3000ML ARTHROMATIC (IV SOLUTION) ×6 IMPLANT
KIT BLADEGUARD II DBL (SET/KITS/TRAYS/PACK) ×2 IMPLANT
KIT ROOM TURNOVER AP CYSTO (KITS) ×2 IMPLANT
LEG HOLDER FOAM (MISCELLANEOUS) ×1
MANIFOLD NEPTUNE II (INSTRUMENTS) ×2 IMPLANT
MARKER SKIN DUAL TIP RULER LAB (MISCELLANEOUS) ×2 IMPLANT
NEEDLE HYPO 18GX1.5 BLUNT FILL (NEEDLE) ×2 IMPLANT
NEEDLE HYPO 21X1.5 SAFETY (NEEDLE) ×2 IMPLANT
NEEDLE SPNL 18GX3.5 QUINCKE PK (NEEDLE) ×2 IMPLANT
NS IRRIG 1000ML POUR BTL (IV SOLUTION) ×2 IMPLANT
PACK ARTHRO LIMB DRAPE STRL (MISCELLANEOUS) ×2 IMPLANT
PAD ABD 5X9 TENDERSORB (GAUZE/BANDAGES/DRESSINGS) ×2 IMPLANT
PAD ARMBOARD 7.5X6 YLW CONV (MISCELLANEOUS) ×2 IMPLANT
PADDING CAST COTTON 6X4 STRL (CAST SUPPLIES) ×2 IMPLANT
SET ARTHROSCOPY INST (INSTRUMENTS) ×2 IMPLANT
SET ARTHROSCOPY PUMP TUBE (IRRIGATION / IRRIGATOR) ×2 IMPLANT
SET BASIN LINEN APH (SET/KITS/TRAYS/PACK) ×2 IMPLANT
SPONGE GAUZE 4X4 12PLY (GAUZE/BANDAGES/DRESSINGS) ×2 IMPLANT
SUT ETHILON 3 0 FSL (SUTURE) IMPLANT
SYR 30ML LL (SYRINGE) ×2 IMPLANT
SYRINGE 10CC LL (SYRINGE) ×2 IMPLANT
WAND 50 DEG COVAC W/CORD (SURGICAL WAND) ×2 IMPLANT
WAND 90 DEG TURBOVAC W/CORD (SURGICAL WAND) IMPLANT
WAND ARTHRO PARAGON T2 (SURGICAL WAND) IMPLANT
YANKAUER SUCT BULB TIP 10FT TU (MISCELLANEOUS) ×6 IMPLANT

## 2014-03-20 NOTE — Interval H&P Note (Signed)
History and Physical Interval Note:  03/20/2014 11:08 AM  Joel Quinn  has presented today for surgery, with the diagnosis of Torn medial meniscus and osteoarthritis left knee  The various methods of treatment have been discussed with the patient and family. After consideration of risks, benefits and other options for treatment, the patient has consented to  Procedure(s): KNEE ARTHROSCOPY WITH MEDIAL MENISECTOMY (Left) as a surgical intervention .  The patient's history has been reviewed, patient examined, no change in status, stable for surgery.  I have reviewed the patient's chart and labs.  Questions were answered to the patient's satisfaction.     Joel Quinn

## 2014-03-20 NOTE — Brief Op Note (Signed)
03/20/2014  12:24 PM  PATIENT:  Joel CrosbySteve A Quinn  63 y.o. male  PRE-OPERATIVE DIAGNOSIS:  Torn medial meniscus and osteoarthritis left knee  POST-OPERATIVE DIAGNOSIS:  Osteoarthritis , loose body, degeneration medial meniscus   PROCEDURE:    Chondroplasty medial femoral condyle  Loose body removal   Debridement trochlea, patella  Synovectomy   Debridement medial meniscus   SURGEON:  Surgeon(s) and Role:    * Vickki HearingStanley E Harrison, MD - Primary  PHYSICIAN ASSISTANT:   ASSISTANTS: none   ANESTHESIA:   general  EBL:  Total I/O In: 900 [I.V.:900] Out: 0   BLOOD ADMINISTERED:none  DRAINS: none   LOCAL MEDICATIONS USED:  MARCAINE     SPECIMEN:  No Specimen  DISPOSITION OF SPECIMEN:  N/A  COUNTS:  YES  TOURNIQUET:    DICTATION: .Dragon Dictation  PLAN OF CARE: Discharge to home after PACU  PATIENT DISPOSITION:  PACU - hemodynamically stable.   Delay start of Pharmacological VTE agent (>24hrs) due to surgical blood loss or risk of bleeding: not applicable  Details of procedure The patient was identified in the preoperative holding area and the left knee was confirmed as the operative site. This was marked with surgeon's initials and the chart was reviewed and updated. The patient was then taken to the operating room for general anesthesia which went well without event.  The right leg was padded on a padded well leg holder and the left leg was placed in an arthroscopic leg holder. The left leg was prepped and draped sterilely. Timeout procedure was completed.  A lateral portal was established and the scope was placed into the joint. Diagnostic arthroscopy was completed. A medial portal was established with the assistance of a spinal needle. Multiple loose cartilaginous fragments were noted in the joint and these were removed with the shaver. A repeat diagnostic arthroscopy revealed that the lateral compartment was essentially normal except for a anterior compartment loose  body. The anterior cruciate ligament and PCL were intact. Moderate amount of synovitis was noted. The patellofemoral joint showed chondromalacia of the patella and trochlea with synovitis in the suprapatellar pouch which was removed with a synovial resector.  The medial compartment was then examined a large chondral lesion was noted over the medial femoral condyle which was essentially a full-thickness lesion and involved the entire weightbearing surface. This was treated with a chondroplasty. We then probed the medial meniscus which essentially was intact. It appeared that the previous surgery remove the undersurface of the posterior horn this was debrided and re\re evaluated with the probe and found to be stable. A. ArthroCare wand was used to remove a portion of the meniscus and a debridement but no formal medial meniscectomy was performed.  The loose body was removed with the assistance of the ArthroCare wand and the duckbill forceps. A grasper was placed and the alligator maneuver was used to detach the loose fragment from the synovial tissue and then the medial portal was widened and the loose body was removed.  The knee was irrigated several times until there were no loose fragments remaining in the joint. We closed the portal sites with 2 medial sutures using 3-0 nylon and one lateral portal suture also using 3-0 nylon. We then injected 60 cc of Marcaine with epinephrine into the joint and applied a sterile dressing, Ace wrap and Cryo/Cuff which was also activated  The patient was extubated and taken to recovery room in stable condition  He will be weightbearing as tolerated with an Ace  wrap and crutches.

## 2014-03-20 NOTE — H&P (Signed)
Arthroscopy left knee

## 2014-03-20 NOTE — Anesthesia Procedure Notes (Signed)
Procedure Name: LMA Insertion Date/Time: 03/20/2014 11:30 AM Performed by: Franco NonesYATES, Hayley Horn S Pre-anesthesia Checklist: Patient identified, Patient being monitored, Emergency Drugs available, Timeout performed and Suction available Patient Re-evaluated:Patient Re-evaluated prior to inductionOxygen Delivery Method: Circle System Utilized Preoxygenation: Pre-oxygenation with 100% oxygen Intubation Type: IV induction Ventilation: Mask ventilation without difficulty LMA: LMA inserted LMA Size: 4.0 Number of attempts: 1 Placement Confirmation: positive ETCO2 and breath sounds checked- equal and bilateral Tube secured with: Tape

## 2014-03-20 NOTE — Telephone Encounter (Signed)
Regarding out-patient surgery scheduled 03/20/14 at [facility], out-patient, again contacted insurer, [employer], Ph [phone] - added CPT codes, [id number] and [phone], in addition to potential CPT codes [id number], 29880; per representative "R.J.  R," no pre-authorization required for any of these codes.  Ref # [id number].

## 2014-03-20 NOTE — H&P (View-Only) (Signed)
Joel CrosbySteve A Gemmer is an 63 y.o. male.   Chief Complaint: LEFT KNE PAIN AND GIVING WAY  HPI:   Subjective:     Joel CrosbySteve A Quinn is a 63 y.o. male 9 year status post arthroscopy left knee, FREQUENT GIVING OUT EPISODES, THEN HE  twisted his knee a few weeks back and presented with pain and swelling; catching, locking, and giving way. I gave him an injection but he continues to have symptoms. [place] to have surgery here instead of [place]    The following portions of the patient's history were reviewed and updated as appropriate: allergies, current medications, past family history, past medical history, past social history, past surgical history and problem list.   Review of Systems Pertinent items are noted in HPI.    Past Medical History  Diagnosis Date  . Diabetes mellitus without complication   . Hypertension   . Hyperlipidemia   . MRSA (methicillin resistant Staphylococcus aureus)     Past Surgical History  Procedure Laterality Date  . Back surgery    . Right shoulder sugery      3 surgeries and shoulder replacement  . Carpal tunnel release Right   . Left middle finger surgery    . Left pinky surgery    . Neck surgery    . Cholecystectomy    . Eye surgery     Current outpatient prescriptions:aspirin 81 MG tablet, Take 81 mg by mouth daily., Disp: , Rfl: ;  chlorpheniramine-HYDROcodone (TUSSIONEX PENNKINETIC ER) 10-8 MG/5ML LQCR, Take 5 mLs by mouth every 12 (twelve) hours as needed for cough., Disp: 100 mL, Rfl: 0;  diltiazem (TIAZAC) 240 MG 24 hr capsule, Take 240 mg by mouth daily., Disp: , Rfl: ;  famotidine (PEPCID) 20 MG tablet, Take 20 mg by mouth daily., Disp: , Rfl:  folic acid (FOLVITE) 800 MCG tablet, Take 800 mcg by mouth daily., Disp: , Rfl: ;  furosemide (LASIX) 40 MG tablet, Take 40 mg by mouth daily., Disp: , Rfl: ;  glipiZIDE (GLUCOTROL) 5 MG tablet, Take by mouth daily before breakfast., Disp: , Rfl: ;  lamoTRIgine (LAMICTAL) 200 MG tablet, Take 200 mg by mouth daily., Disp:  , Rfl: ;  metFORMIN (GLUCOPHAGE) 500 MG tablet, Take by mouth 2 (two) times daily with a meal., Disp: , Rfl:  nabumetone (RELAFEN) 500 MG tablet, Take 1 tablet (500 mg total) by mouth 2 (two) times daily., Disp: 60 tablet, Rfl: 5;  oseltamivir (TAMIFLU) 75 MG capsule, Take 1 capsule (75 mg total) by mouth every 12 (twelve) hours., Disp: 10 capsule, Rfl: 0;  Potassium Gluconate 595 MG CAPS, Take by mouth daily., Disp: , Rfl: ;  predniSONE (DELTASONE) 10 MG tablet, 5,4,3,2,1 - take with food, Disp: 15 tablet, Rfl: 0 simvastatin (ZOCOR) 20 MG tablet, Take 20 mg by mouth daily., Disp: , Rfl: ;  tamsulosin (FLOMAX) 0.4 MG CAPS capsule, Take 0.4 mg by mouth., Disp: , Rfl:  No family history on file. Social History:  reports that he has never smoked. He does not have any smokeless tobacco history on file. He reports that he drinks alcohol. His drug history is not on file.  Allergies: No Known Allergies   (Not in a hospital admission)  No results found for this or any previous visit (from the past 48 hour(s)). No results found.  ROS SEE ABOVE   Blood pressure 122/82, height 5\' 11"  (1.803 m), weight 270 lb (122.471 kg). Physical Exam    Objective:     BP  119/73  Ht 5\' 11"  (1.803 m)  Wt 270 lb (122.471 kg)  BMI 37.67 kg/m2 General appearance is normal, the patient is alert and oriented x3 with normal mood and affect.    Right knee:  normal and no effusion, full active range of motion, no joint line tenderness, ligamentous structures intact.   Left knee:   positive exam findings: effusion, medial joint line tenderness, tenderness noted medial, Joint line and over the medial femoral condyle as well as the medial patellar facet, positive McMurray and ROM limited to approximately 130 degrees, negative exam findings: no erythema, ACL stable, PCL stable, MCL stable, LCL stable and no patellar laxity and No other abnormal findings distal neurovascular exam intact angulation slight limp skin warm dry and  intact without rash lesion or ulceration     X-ray Left knee: shows DJD changes, likely chronic and Medial joint space narrowing with patellofemoral joint space narrowing of the medial side osteophytes are noted   3 views of the knee show that there is medial joint space narrowing and patellofemoral narrowing medially there is a small joint effusion. We do see small osteophytes as well.  Impression medial meniscus tear, with osteoarthritis of the patellofemoral joint and the medial compartment.  Assessment/Plan ARTHROSCOPY WITH MEDIAL MENISECTOMY   Fuller Canada 03/18/2014, 11:09 AM

## 2014-03-20 NOTE — Anesthesia Postprocedure Evaluation (Signed)
Anesthesia Post Note  Patient: Joel CrosbySteve A Blyden  Procedure(s) Performed: Procedure(s) (LRB): KNEE ARTHROSCOPY WITH MEDIAL MENISECTOMY and DEBRIDEMENT   (Left) CHONDROPLASTY-MEDIAL LEFT KNEE (Left) REMOVAL LOOSE BODY IN LEFT KNEE-lateral (Left)  Anesthesia type: General  Patient location: PACU  Post pain: Pain level controlled  Post assessment: Post-op Vital signs reviewed, Patient's Cardiovascular Status Stable, Respiratory Function Stable, Patent Airway, No signs of Nausea or vomiting and Pain level controlled  Last Vitals:  Filed Vitals:   03/20/14 1233  BP: 112/64  Pulse: 89  Temp: 36.4 C  Resp: 14    Post vital signs: Reviewed and stable  Level of consciousness: awake and alert   Complications: No apparent anesthesia complications

## 2014-03-20 NOTE — Interval H&P Note (Signed)
History and Physical Interval Note:  03/20/2014 11:08 AM  Joel Quinn  has presented today for surgery, with the diagnosis of Torn medial meniscus and osteoarthritis left knee  The various methods of treatment have been discussed with the patient and family. After consideration of risks, benefits and other options for treatment, the patient has consented to  Procedure(s): KNEE ARTHROSCOPY WITH MEDIAL MENISECTOMY (Left) as a surgical intervention .  The patient's history has been reviewed, patient examined, no change in status, stable for surgery.  I have reviewed the patient's chart and labs.  Questions were answered to the patient's satisfaction.     Stanley Harrison   

## 2014-03-20 NOTE — Interval H&P Note (Signed)
History and Physical Interval Note:  03/20/2014 9:49 AM  Joel Quinn  has presented today for surgery, with the diagnosis of Torn medial meniscus and osteoarthritis left knee  The various methods of treatment have been discussed with the patient and family. After consideration of risks, benefits and other options for treatment, the patient has consented to  Procedure(s): KNEE ARTHROSCOPY WITH MEDIAL MENISECTOMY (Left) as a surgical intervention .  The patient's history has been reviewed, patient examined, no change in status, stable for surgery.  I have reviewed the patient's chart and labs.  Questions were answered to the patient's satisfaction.     Fuller CanadaStanley Harrison

## 2014-03-20 NOTE — Discharge Instructions (Signed)
Ankle Arthroscopy, Care After Refer to this sheet in the next few weeks. These instructions provide you with information on caring for yourself after your procedure. Your health care provider may also give you more specific instructions. Your treatment has been planned according to current medical practices, but problems sometimes occur. Call your health care provider if you have any problems or questions after your procedure. WHAT TO EXPECT AFTER THE PROCEDURE After your procedure, it is typical to have the following sensations:  Your ankles may be swollen, stiff, and painful.  You may be constipated from the pain medicine given. HOME CARE INSTRUCTIONS  Keep your ankle elevated and wrapped to keep swelling down and to decrease pain. This also speeds healing.  Apply ice to the injured area:  Put ice in a plastic bag.  Place a towel between your skin and the bag.  Leave the ice on for 20 minutes, 2 3 times a day.  If you are constipated, drink 6 8 glasses of water a day and eat fruits and vegetables.  Remove the dressings as directed and shower as directed by your health care provider.  If physical therapy and exercises are prescribed by your health care provider, do them as directed.  You will be given medicine to control the pain. Only take over-the-counter or prescription medicines for pain, discomfort, or fever as directed by your health care provider. SEEK MEDICAL CARE IF:  You have a fever.  You have pus coming from your incision site.  Your incision site begins to stink.  Your incision site breaks open after your stitches or tape has been removed.  You have numbness in your foot or toes that gets worse. SEEK IMMEDIATE MEDICAL CARE IF:   You have chest pain or shortness of breath.  You have ankle pain that is not relieved with pain medicine. Document Released: 10/03/2013 Document Reviewed: 08/01/2013 [facility] Patient Information 2014 [place], [place].

## 2014-03-20 NOTE — Transfer of Care (Signed)
Immediate Anesthesia Transfer of Care Note  Patient: Joel Quinn  Procedure(s) Performed: Procedure(s) (LRB): KNEE ARTHROSCOPY WITH MEDIAL MENISECTOMY and DEBRIDEMENT   (Left) CHONDROPLASTY-MEDIAL LEFT KNEE (Left) REMOVAL LOOSE BODY IN LEFT KNEE-lateral (Left)  Patient Location: PACU  Anesthesia Type: General  Level of Consciousness: awake  Airway & Oxygen Therapy: Patient Spontanous Breathing and non-rebreather face mask  Post-op Assessment: Report given to PACU RN, Post -op Vital signs reviewed and stable and Patient moving all extremities  Post vital signs: Reviewed and stable  Complications: No apparent anesthesia complications

## 2014-03-20 NOTE — Interval H&P Note (Signed)
History and Physical Interval Note:  03/20/2014 11:09 AM  Joel CrosbySteve A Dikes  has presented today for surgery, with the diagnosis of Torn medial meniscus and osteoarthritis left knee  The various methods of treatment have been discussed with the patient and family. After consideration of risks, benefits and other options for treatment, the patient has consented to  Procedure(s): KNEE ARTHROSCOPY WITH MEDIAL MENISECTOMY (Left) as a surgical intervention .  The patient's history has been reviewed, patient examined, no change in status, stable for surgery.  I have reviewed the patient's chart and labs.  Questions were answered to the patient's satisfaction.     Fuller CanadaStanley Hermon Zea

## 2014-03-20 NOTE — Op Note (Signed)
03/20/2014  12:24 PM  PATIENT:  Marylin CrosbySteve A Barthel  63 y.o. male  PRE-OPERATIVE DIAGNOSIS:  Torn medial meniscus and osteoarthritis left knee  POST-OPERATIVE DIAGNOSIS:  Osteoarthritis , loose body, degeneration medial meniscus   PROCEDURE:    Chondroplasty medial femoral condyle  Loose body removal   Debridement trochlea, patella  Synovectomy   Debridement medial meniscus   SURGEON:  Surgeon(s) and Role:    * Vickki HearingStanley E Tametria Aho, MD - Primary  PHYSICIAN ASSISTANT:   ASSISTANTS: none   ANESTHESIA:   general  EBL:  Total I/O In: 900 [I.V.:900] Out: 0   BLOOD ADMINISTERED:none  DRAINS: none   LOCAL MEDICATIONS USED:  MARCAINE     SPECIMEN:  No Specimen  DISPOSITION OF SPECIMEN:  N/A  COUNTS:  YES  TOURNIQUET:    DICTATION: .Dragon Dictation  PLAN OF CARE: Discharge to home after PACU  PATIENT DISPOSITION:  PACU - hemodynamically stable.   Delay start of Pharmacological VTE agent (>24hrs) due to surgical blood loss or risk of bleeding: not applicable  Details of procedure The patient was identified in the preoperative holding area and the left knee was confirmed as the operative site. This was marked with surgeon's initials and the chart was reviewed and updated. The patient was then taken to the operating room for general anesthesia which went well without event.  The right leg was padded on a padded well leg holder and the left leg was placed in an arthroscopic leg holder. The left leg was prepped and draped sterilely. Timeout procedure was completed.  A lateral portal was established and the scope was placed into the joint. Diagnostic arthroscopy was completed. A medial portal was established with the assistance of a spinal needle. Multiple loose cartilaginous fragments were noted in the joint and these were removed with the shaver. A repeat diagnostic arthroscopy revealed that the lateral compartment was essentially normal except for a anterior compartment loose  body. The anterior cruciate ligament and PCL were intact. Moderate amount of synovitis was noted. The patellofemoral joint showed chondromalacia of the patella and trochlea with synovitis in the suprapatellar pouch which was removed with a synovial resector.  The medial compartment was then examined a large chondral lesion was noted over the medial femoral condyle which was essentially a full-thickness lesion and involved the entire weightbearing surface. This was treated with a chondroplasty. We then probed the medial meniscus which essentially was intact. It appeared that the previous surgery remove the undersurface of the posterior horn this was debrided and re\re evaluated with the probe and found to be stable. A. ArthroCare wand was used to remove a portion of the meniscus and a debridement but no formal medial meniscectomy was performed.  The loose body was removed with the assistance of the ArthroCare wand and the duckbill forceps. A grasper was placed and the alligator maneuver was used to detach the loose fragment from the synovial tissue and then the medial portal was widened and the loose body was removed.  The knee was irrigated several times until there were no loose fragments remaining in the joint. We closed the portal sites with 2 medial sutures using 3-0 nylon and one lateral portal suture also using 3-0 nylon. We then injected 60 cc of Marcaine with epinephrine into the joint and applied a sterile dressing, Ace wrap and Cryo/Cuff which was also activated  The patient was extubated and taken to recovery room in stable condition  He will be weightbearing as tolerated with an Ace  wrap and crutches.

## 2014-03-20 NOTE — Anesthesia Preprocedure Evaluation (Signed)
Anesthesia Evaluation  Patient identified by MRN, date of birth, ID band Patient awake    Reviewed: Allergy & Precautions, H&P , NPO status , Patient's Chart, lab work & pertinent test results  History of Anesthesia Complications (+) PONV and history of anesthetic complications  Airway Mallampati: II TM Distance: >3 FB Neck ROM: Full    Dental  (+) Teeth Intact   Pulmonary sleep apnea and Continuous Positive Airway Pressure Ventilation ,  breath sounds clear to auscultation        Cardiovascular hypertension, Pt. on medications Rhythm:Regular Rate:Normal     Neuro/Psych TIA Neuromuscular disease (reports RSD of LE's)    GI/Hepatic GERD-  Medicated and Controlled,  Endo/Other  diabetes, Type 2, Oral Hypoglycemic AgentsMorbid obesity  Renal/GU      Musculoskeletal   Abdominal   Peds  Hematology   Anesthesia Other Findings   Reproductive/Obstetrics                           Anesthesia Physical Anesthesia Plan  ASA: III  Anesthesia Plan: General   Post-op Pain Management:    Induction: Intravenous  Airway Management Planned: LMA  Additional Equipment:   Intra-op Plan:   Post-operative Plan: Extubation in OR  Informed Consent: I have reviewed the patients History and Physical, chart, labs and discussed the procedure including the risks, benefits and alternatives for the proposed anesthesia with the patient or authorized representative who has indicated his/her understanding and acceptance.     Plan Discussed with:   Anesthesia Plan Comments:         Anesthesia Quick Evaluation

## 2014-03-22 ENCOUNTER — Encounter (HOSPITAL_COMMUNITY): Payer: Self-pay | Admitting: Orthopedic Surgery

## 2014-03-28 ENCOUNTER — Ambulatory Visit (INDEPENDENT_AMBULATORY_CARE_PROVIDER_SITE_OTHER): Payer: Self-pay | Admitting: Orthopedic Surgery

## 2014-03-28 VITALS — BP 131/81 | Ht 71.0 in | Wt 279.0 lb

## 2014-03-28 DIAGNOSIS — Z9889 Other specified postprocedural states: Secondary | ICD-10-CM

## 2014-03-28 NOTE — Progress Notes (Signed)
Patient ID: Marylin CrosbySteve A Renegar, male   DOB: 20-Oct-1951, 63 y.o.   MRN: [id number] Postop visit  Chief Complaint  Patient presents with  . Follow-up    Post op # 1 SALK DOS 03/20/14    Procedure arthroscopy left knee chondroplasty PRE-OPERATIVE DIAGNOSIS:  Torn medial meniscus and osteoarthritis left knee  POST-OPERATIVE DIAGNOSIS:  Osteoarthritis , loose body, degeneration medial meniscus   PROCEDURE:     Chondroplasty medial femoral condyle  Loose body removal    Debridement trochlea, patella  Synovectomy   Debridement medial meniscus   Pain medication no medicine needed at this time stop Percocet no longer needed  Appearance of knee the knee looks very good the portals are clean he does have an effusion  Physical therapy will be done at  local hospital  Patient complaints none at this time ambulating without assistive devices  Plan start physical therapy return in 3 weeks

## 2014-03-28 NOTE — Patient Instructions (Addendum)
Call to arrange therapy at [facility] 

## 2014-04-01 ENCOUNTER — Ambulatory Visit (HOSPITAL_COMMUNITY)
Admission: RE | Admit: 2014-04-01 | Discharge: 2014-04-01 | Disposition: A | Discharge status: Home / Self Care | Payer: Medicare HMO | Source: Ambulatory Visit | Attending: Orthopedic Surgery | Admitting: Orthopedic Surgery

## 2014-04-01 DIAGNOSIS — R262 Difficulty in walking, not elsewhere classified: Secondary | ICD-10-CM | POA: Insufficient documentation

## 2014-04-01 DIAGNOSIS — M6281 Muscle weakness (generalized): Secondary | ICD-10-CM | POA: Insufficient documentation

## 2014-04-01 DIAGNOSIS — M25569 Pain in unspecified knee: Secondary | ICD-10-CM | POA: Insufficient documentation

## 2014-04-01 DIAGNOSIS — M25562 Pain in left knee: Secondary | ICD-10-CM

## 2014-04-01 DIAGNOSIS — IMO0001 Reserved for inherently not codable concepts without codable children: Secondary | ICD-10-CM | POA: Insufficient documentation

## 2014-04-01 NOTE — Evaluation (Signed)
Physical Therapy Evaluation  Patient Details  Name: Joel Quinn MRN: [id number] Date of Birth: 09-Aug-1951  Today's Date: 04/01/2014 Time: 4098-1191 PT Time Calculation (min): 45 min Charges : PT evaluation               Visit#: 1 of 9  Re-eval: 05/01/14 Assessment Diagnosis: left knee scope  Surgical Date: 03/20/14 Next MD Visit: Dr Romeo Apple 04/23/14   Authorization: [employer]     Authorization Time Period:    Authorization Visit#: 1 of 10   Past Medical History:  Past Medical History  Diagnosis Date  . Diabetes mellitus without complication   . Hypertension   . Hyperlipidemia   . MRSA (methicillin resistant Staphylococcus aureus)   . Complication of anesthesia   . PONV (postoperative nausea and vomiting)   . TIA (transient ischemic attack)     had Bell's palsy with tia and complete numbness of left side at that time, but no deficits.  . Blood dyscrasia     pt states he wastold in the past he had a mild clotting siorder where his red blood cells werent slippery enough'\" and started him on folic acis  . Sleep apnea     CPAP-settings on 12  . Arthritis   . RSD lower limb     lower legs and feet   Past Surgical History:  Past Surgical History  Procedure Laterality Date  . Right shoulder sugery      3 surgeries and shoulder replacement  . Carpal tunnel release Right   . Left middle finger surgery    . Left pinky surgery    . Neck surgery      fusion c6-c7  . Cholecystectomy    . Back surgery      l5-s1 fusion with rodding  . Eye surgery Left     cataract  . Knee arthroscopy with medial menisectomy Left 03/20/2014    Procedure: KNEE ARTHROSCOPY WITH MEDIAL MENISECTOMY and DEBRIDEMENT  ;  Surgeon: Vickki Hearing, MD;  Location: [facility];  Service: Orthopedics;  Laterality: Left;  . Chondroplasty Left 03/20/2014    Procedure: CHONDROPLASTY-MEDIAL LEFT KNEE;  Surgeon: Vickki Hearing, MD;  Location: [facility];  Service: Orthopedics;  Laterality: Left;  . Foreign  body removal Left 03/20/2014    Procedure: REMOVAL LOOSE BODY IN LEFT KNEE-lateral;  Surgeon: Vickki Hearing, MD;  Location: [facility];  Service: Orthopedics;  Laterality: Left;    Subjective Symptoms/Limitations Symptoms: s/p left knee scope ( 03/20/14)  for medial meniscsus debridement and chondroplasty medial left femoral condyle, 2nd knee scope one about 8 yearsd ago, complains L knee feels weak, unsteady, hx of buckling, neurostimulator for back pain , twisted knee going up stairs 7-8 weeks ago causing  left knee injury, temp living in apartment  with  no elevator living on 3rd floor  Pertinent History: prior back and neck surgery, HBP, diabetes , B foot  numbness , 2 back surgeries , R reverse total shoulder  Limitations: Lifting;Standing;Walking;House hold activities How long can you stand comfortably?: 5 min  How long can you walk comfortably?: 5 min  Patient Stated Goals: reduce pain , no buckling at knee  , use stairs better since apartment has 3 flights no elevator  Pain Assessment Currently in Pain?: Yes Pain Score: 6 /10 average , intensity varies  Pain Location: Knee Pain Orientation: Left Pain Type: Acute pain Pain Relieving Factors: ice 4 x a day  Per dr   Precautions/Restrictions  Precautions Precautions:  Other (comment) Precaution Comments: B foot numbness   Balance Screening Balance Screen Has the patient fallen in the past 6 months: No Has the patient had a decrease in activity level because of a fear of falling? : Yes Is the patient reluctant to leave their home because of a fear of falling? : Yes  Prior Functioning  Prior Function: back pain, leg pain with ADLS 15 -20 min max walking tolerance  Level of Independence: Independent with basic ADLs;Independent with gait  Able to Take Stairs?: yes but very difficult per knee and back pain  Driving: Yes Vocation: Retired per back pain    Cognition/Observation Cognition Orientation Level: Oriented  X4 Observation/Other Assessments Observations: post op swelling around scope portals 2 portals , scabbing   Sensation/Coordination/Flexibility/Functional Tests Functional Tests Functional Tests: FOTO 43 Functional Tests: sit to stand B hand use, stand to stand no hand use   Assessment LLE AROM (degrees) Left Knee Extension: 0 Left Knee Flexion: 120 LLE Strength Left Hip Flexion: 4/5 Left Knee Flexion: 4/5 Left Knee Extension: 3+/5 Left Ankle Dorsiflexion: 5/5 Unable to left SLR per back pain, left hamstring flexibility 75 degrees  HIP flexion and external and internal rotation WNL  Exercise/Treatments Mobility/Balance  Ambulation/Gait Ambulation/Gait: Yes Gait Pattern: Decreased hip/knee flexion - left;Step-through pattern;Antalgic     Standing Heel Raises: 10 reps Forward Step Up: Left;Step Height: 4";Hand Hold: 1;15 reps S       Physical Therapy Assessment and Plan PT Assessment and Plan Clinical Impression Statement: 63 yr old male referred post left knee arthroscope  (03/20/14) for medial meniscus debridement and medial femoral chondroplasty, he has significanrt hx for back pain and RSD symptoms realted to back surgery with B foot numbness. He has neuro back pain stimulator. His left knee is progressing post surgery per  functional ROM, however needs furtehr strength and stability training.  Pt will benefit from skilled therapeutic intervention in order to improve on the following deficits: Abnormal gait;Decreased balance;Difficulty walking;Decreased strength;Pain Rehab Potential: Good Clinical Impairments Affecting Rehab Potential: low back pain, left knee DJD  PT Frequency: Min 3X/week PT Duration:  (3 weeks ) PT Treatment/Interventions: Gait training;Stair training;Functional mobility training;Therapeutic activities;Therapeutic exercise;Manual techniques;Patient/family education;Neuromuscular re-education;Balance training PT Plan: post op  knee scope program, nu  step, balance/ stability exercises     Goals Home Exercise Program Pt/caregiver will Perform Home Exercise Program: For increased strengthening PT Goal: Perform Home Exercise Program - Progress: Goal set today PT Short Term Goals Time to Complete Short Term Goals: 3 weeks PT Short Term Goal 1: ambulate safely 10 min no device or reports of knee instability,buckling, loss of balance on level terrain  PT Short Term Goal 2: improve left hamstring strength to 4+/5 and qaudriceps to 4+/5 for improved stability   PT Short Term Goal 3: left single leg stand 10 seconds unsupported to improve knee stabilty and safety  PT Short Term Goal 4: deny difficulty getting in and out of car  PT Short Term Goal 5: ascend and descend flgiht of stairs using 1 handrail non reciprocal with confidence and max knee pain 2/10    Problem List Patient Active Problem List   Diagnosis Date Noted  . Left knee pain 04/01/2014  . Medial meniscus, posterior horn derangement 03/18/2014  . Arthritis of knee, degenerative 03/18/2014       GP Functional Assessment Tool Used: FOTO 43 / 57% mobility impairment  Functional Limitation: Mobility: Walking and moving around Mobility: Walking and Moving Around Current Status [phone]): At  least 40 percent but less than 60 percent impaired, limited or restricted Mobility: Walking and Moving Around Goal Status [phone]): At least 20 percent but less than 40 percent impaired, limited or restricted  Havilah Topor 04/01/2014, 11:55 AM  Physician Documentation Your signature is required to indicate approval of the treatment plan as stated above.  Please sign and either send electronically or make a copy of this report for your files and return this physician signed original.   Please mark one 1.__approve of plan  2. ___approve of plan with the following conditions.   ______________________________                                                          _____________________ Physician  Signature                                                                                                             Date

## 2014-04-03 ENCOUNTER — Ambulatory Visit (HOSPITAL_COMMUNITY)
Admission: RE | Admit: 2014-04-03 | Discharge: 2014-04-03 | Disposition: A | Discharge status: Home / Self Care | Payer: Medicare HMO | Source: Ambulatory Visit | Attending: Orthopedic Surgery | Admitting: Orthopedic Surgery

## 2014-04-03 NOTE — Progress Notes (Signed)
Physical Therapy Treatment Patient Details  Name: Joel Quinn MRN: [id number] Date of Birth: March 27, 1951  Today's Date: 04/03/2014 Time: 1015-1055 PT Time Calculation (min): 40 min  Visit#: 2 of 9  Re-eval: 05/01/14 Authorization: medicare aetna   Authorization Visit#: 2 of 10  Charges:  therex 38  Subjective:  Pt states he has no complaints today and his pain is same as previous appointment. Pain Assessment Currently in Pain?: Yes Pain Score: 6  Pain Location: Knee Pain Orientation: Left  Precautions/Restrictions  Precautions Precautions: Other (comment) Precaution Comments: strengthening restictions due to RSD and lumbar surgeries  Exercise/Treatments Aerobic Stationary Bike: nustep no incline, flat surface no resistance 10 minutes Standing Heel Raises: 10 reps;Limitations Heel Raises Limitations: toeraises 10 reps Knee Flexion: Left;10 reps Lateral Step Up: Left;Step Height: 4";10 reps;Hand Hold: 1 Forward Step Up: Left;Step Height: 4";Hand Hold: 1;15 reps Step Down: Left;Step Height: 4";10 reps;Hand Hold: 1 Rocker Board: 1 minute;Other (comment) (AP and RL) Supine Quad Sets: 10 reps;Both Short Arc [employer] Tim[employer] CompanyQuad Sets: 10 reps;Both     Physical Therapy Assessment and Plan PT Assessment:  Added new functional strengthening activities with concentration on quadriceps.  Pt without c/o pain or difficulty performing any activities.  Required extra pillows for positioning in supine.    Clinical Impairments Affecting Rehab Potential: low back pain, left knee DJD  PT Plan: post op knee scope program.  Per patient, MD does not want intense strengthening of LE's due to RSD and lumbar surgeries.     Problem List Patient Active Problem List   Diagnosis Date Noted  . Left knee pain 04/01/2014  . Medial meniscus, posterior horn derangement 03/18/2014  . Arthritis of knee, degenerative 03/18/2014   GP Functional Limitation: Mobility: Walking and moving around  Joel Quinn,  PTA/CLT 04/03/2014, 12:06 PM

## 2014-04-04 ENCOUNTER — Ambulatory Visit (HOSPITAL_COMMUNITY): Payer: Medicare HMO | Admitting: Physical Therapy

## 2014-04-08 ENCOUNTER — Ambulatory Visit (HOSPITAL_COMMUNITY)
Admission: RE | Admit: 2014-04-08 | Discharge: 2014-04-08 | Disposition: A | Discharge status: Home / Self Care | Payer: Medicare HMO | Source: Ambulatory Visit | Attending: Orthopedic Surgery | Admitting: Orthopedic Surgery

## 2014-04-08 NOTE — Progress Notes (Signed)
Physical Therapy Treatment Patient Details  Name: Joel CrosbySteve A Quinn MRN: [id number] Date of Birth: 09/16/1951  Today's Date: 04/08/2014 Time: 4098-11910850-0945 PT Time Calculation (min): 55 min  Visit#: 3 of 9  Re-eval: 05/01/14 Authorization: medicare aetna   Authorization Visit#: 3 of 10  Charges:  therex [phone] (30'), manual [phone] (10'), ice [phone]   Subjective: Symptoms/Limitations Symptoms: Pt states he was sore after last visit.  States he has minimal pain across patellar region at rest (1/10) but increases to 7/10 with WB and ambulation. Pain Assessment Currently in Pain?: Yes Pain Score: 7  Pain Location: Knee Pain Orientation: Left   Exercise/Treatments Aerobic Stationary Bike: nustep no incline, flat surface no resistance 10 minutes Standing Heel Raises: 15 reps;Limitations Heel Raises Limitations: toeraises 15 reps Knee Flexion: Left;10 reps Lateral Step Up: Left;Step Height: 4";10 reps;Hand Hold: 1 Forward Step Up: Left;Step Height: 4";Hand Hold: 1;15 reps Step Down: Left;Step Height: 4";10 reps;Hand Hold: 1 Rocker Board: 1 minute;Other (comment) Supine Quad Sets: Both;15 reps Short Arc Quad Sets: Both;15 reps   Modalities Modalities: Cryotherapy Manual Therapy Manual Therapy: Other (comment) Other Manual Therapy: scar massage to scope location LT knee Cryotherapy Number Minutes Cryotherapy: 10 Minutes Cryotherapy Location: Knee Type of Cryotherapy: Ice pack  Physical Therapy Assessment and Plan PT Assessment and Plan Clinical Impression Statement: Kept most activity/exercises constant today due to soreness after last visit.  Pt with most difficulty with step up activities and unable to complete but 5 reps of lateral step ups.  Added scar massage today to scope locations due to tightness/bulging of tissue in these areas.  Ended session with icepack.  PT Duration:  (3 weeks ) PT Plan: post op  knee scope program.  Progress slowly as tolerated.     Problem  List Patient Active Problem List   Diagnosis Date Noted  . Left knee pain 04/01/2014  . Medial meniscus, posterior horn derangement 03/18/2014  . Arthritis of knee, degenerative 03/18/2014       GP Functional Limitation: Mobility: Walking and moving around  Joel NidaAmy B Jesusa Quinn, PTA/CLT 04/08/2014, 10:18 AM

## 2014-04-10 ENCOUNTER — Ambulatory Visit (HOSPITAL_COMMUNITY)
Admission: RE | Admit: 2014-04-10 | Discharge: 2014-04-10 | Disposition: A | Discharge status: Home / Self Care | Payer: Medicare HMO | Source: Ambulatory Visit | Attending: Orthopedic Surgery | Admitting: Orthopedic Surgery

## 2014-04-10 DIAGNOSIS — M25562 Pain in left knee: Secondary | ICD-10-CM

## 2014-04-10 NOTE — Progress Notes (Signed)
Physical Therapy Treatment Patient Details  Name: Joel Quinn MRN: [id number] Date of Birth: 07/02/1951  Today's Date: 04/10/2014 Time: [id number] PT Time Calculation (min): 45 min Charges: therapeutic exercise [id number]  Visit#: 4 of 9  Re-eval: 05/01/14 Assessment Diagnosis: left knee scope  Surgical Date: 03/20/14 Next MD Visit: Dr Romeo AppleHarrison 04/23/14   Subjective: Symptoms/Limitations Symptoms: Patient states he has been getting around slowly lately with difficulty going down steps greater than going up steps.  Limitations: Lifting;Standing;Walking;House hold activities How long can you stand comfortably?: 5 min  How long can you walk comfortably?: 5 min  Pain Assessment Currently in Pain?: Yes Pain Score: 4   Exercise/Treatments Stretches Active Hamstring Stretch: 2 reps;30 seconds Hip Flexor Stretch: 2 reps;30 seconds Gastroc Stretch: 30 seconds;2 reps Aerobic Stationary Bike: nustep no incline, flat surface no resistance 5 minutes Standing Forward Lunges: 20 reps;2 sets;Other (comment) (to 8" box with manual techniques to increase hip ER and Abd) Forward Step Up: Step Height: 6";10 reps;Both Functional Squat: 3 sets;5 reps (with cues to increase glut loading; Lt foot forward, feet =)  Physical Therapy Assessment and Plan PT Assessment and Plan Clinical Impression Statement: Patient notes continued soreness and pain in Lt knee. Patient ambulates with decreased and uneven stride length with slight limp on Lt LE, patient also displays limited femur external rotation and abduction at the hip and ealry heel off during the heel off phase of gait. Following stretches of hip flexors, gastroc, and hamstrings patient displays an increased stride length and more even stance time during gait.  Patient demonstrates continued weakness of quads and gluts resulting in pain and difficulty with sit to stand following cuing to increase forward flxion duering sit to standppatient notes decreased  Lt Knee pain and easier performanc eopf sit to stand.. Pt will benefit from skilled therapeutic intervention in order to improve on the following deficits: Abnormal gait;Decreased balance;Difficulty walking;Decreased strength;Pain PT Treatment/Interventions: Gait training;Stair training;Functional mobility training;Therapeutic activities;Therapeutic exercise;Manual techniques;Patient/family education;Neuromuscular re-education;Balance training PT Plan: post op  knee scope program.  Progress slowly as tolerated. Continue stretches to improve gait mechanics. Add piriformis stretch as tolerated.     Goals PT Short Term Goals PT Short Term Goal 1 - Progress: Progressing toward goal PT Short Term Goal 2 - Progress: Progressing toward goal PT Short Term Goal 3 - Progress: Progressing toward goal PT Short Term Goal 4 - Progress: Progressing toward goal PT Short Term Goal 5 - Progress: Progressing toward goal  Problem List Patient Active Problem List   Diagnosis Date Noted  . Left knee pain 04/01/2014  . Medial meniscus, posterior horn derangement 03/18/2014  . Arthritis of knee, degenerative 03/18/2014    PT Plan of Care PT Home Exercise Plan: Gastrocnemius, hamstring, and hip flexor stretch.   GP    Hardie Pulleyash R Terika Pillard PT DPT.  04/10/2014, 10:52 AM

## 2014-04-12 ENCOUNTER — Ambulatory Visit (HOSPITAL_COMMUNITY)
Admission: RE | Admit: 2014-04-12 | Discharge: 2014-04-12 | Disposition: A | Discharge status: Home / Self Care | Payer: Medicare HMO | Source: Ambulatory Visit | Attending: Orthopedic Surgery | Admitting: Orthopedic Surgery

## 2014-04-12 DIAGNOSIS — M25562 Pain in left knee: Secondary | ICD-10-CM

## 2014-04-12 NOTE — Progress Notes (Signed)
Physical Therapy Treatment Patient Details  Name: Joel Quinn MRN: [id number] Date of Birth: 05/31/1951  Today's Date: 04/12/2014 Time: 1015-1100 PT Time Calculation (min): 45 min Charges: 1015-1025 manual therapy, 1025-1100 therapeutic Exercise.  Visit#: 5 of 9  Re-eval: 05/01/14 Assessment Diagnosis: left knee scope  Surgical Date: 03/20/14 Next MD Visit: Dr Aline Brochure 04/23/14   Authorization: Bernadene Person   Authorization Time Period:    Authorization Visit#: 5 of 10   Subjective: Symptoms/Limitations Symptoms: Patient states that he still feels the knee cathing and when it does he feels it for hours . He notices catching especially when ambulating down steps. No complaint of Lt knee pain throughout session except during Lateral lunges which was improved following verbal cue to internally rotate foot.  Pertinent History: prior back and neck surgery, HBP, diabetes , B foot  numbness , 2 back surgeries , R reverse total shoulder. History of L5-S1 disc replacement and fusion.  Pain Assessment Currently in Pain?: No/denies  Exercise/Treatments Stretches Active Hamstring Stretch: 30 seconds;3 reps Hip Flexor Stretch: 30 seconds;3 reps Gastroc Stretch: 30 seconds;3 reps Aerobic Stationary Bike: Bike 5 min with no resistance to increase knee mobility. Standing Forward Lunges: 10 reps;Both (8" box) Side Lunges: Both;10 reps (10x 8" box) Forward Step Up: 10 reps;Both;Step Height: 8" Other Standing Knee Exercises: Cross ove lunge to 8" box 10x both Supine Rt foot forward bridge 10x 3sec hold  Manual Therapy Manual Therapy: Joint mobilization Joint Mobilization: Lt hip posterior tilt mobilization grade 4, Rt hip anterior tilt mobilization grade 4, MET to correct hip alignment,  Hip alignment and leg length misalignment corrected following manual.  Physical Therapy Assessment and Plan PT Assessment and Plan Clinical Impression Statement: Patient walks in with noticebale Lt foot  external rotation and limp, Patient's hip alignment misaligned left pelvid anterior tilt, Rt innominate posterior tilt. Following manual stretching andpelvic rotation joint mobilizations hip alignment improved along with patient gait. Patient continued to [facility] with wide gait with toes externally rotated and limited hip mobility and limited hip and tibia internal rotation at  foot flat resulting in decreased deceleration control. Following exercises that including lateral lunges and cross over lunges with manual cues  to increase internal rotation gait improved with  decreased stride width and decreased hip external  rotation and decreased toe out.  Patient was unable to perform piriformis stretch due to back pain secondary to lumbar spine fusion. Patient was able to tolerate manual piriformis strethc by therapist resulting in further improvement of toe out during gait.  Pt will benefit from skilled therapeutic intervention in order to improve on the following deficits: Abnormal gait;Decreased balance;Difficulty walking;Decreased strength;Pain Rehab Potential: Good PT Treatment/Interventions: Gait training;Stair training;Functional mobility training;Therapeutic activities;Therapeutic exercise;Manual techniques;Patient/family education;Neuromuscular re-education;Balance training PT Plan: post op  knee scope program.  Progress slowly as tolerated. Continue stretches to improve gait mechanics. Reassess hip alignment next session.    Goals PT Short Term Goals PT Short Term Goal 1 - Progress: Partly met PT Short Term Goal 2 - Progress: Progressing toward goal PT Short Term Goal 3 - Progress: Progressing toward goal PT Short Term Goal 4 - Progress: Progressing toward goal PT Short Term Goal 5 - Progress: Progressing toward goal  Problem List Patient Active Problem List   Diagnosis Date Noted  . Left knee pain 04/01/2014  . Medial meniscus, posterior horn derangement 03/18/2014  . Arthritis of knee,  degenerative 03/18/2014    PT Plan of Care PT Home Exercise Plan: Gastrocnemius, hamstring, and hip  flexor stretch. Lateral lunges to a step.  Consulted and Agree with Plan of Care: Patient  GP    Suzette Battiest Rozell Kettlewell Pt DPT 04/12/2014, 11:14 AM

## 2014-04-15 ENCOUNTER — Ambulatory Visit (HOSPITAL_COMMUNITY): Payer: Medicare HMO

## 2014-04-17 ENCOUNTER — Ambulatory Visit (HOSPITAL_COMMUNITY): Payer: Medicare HMO | Admitting: Physical Therapy

## 2014-04-19 ENCOUNTER — Ambulatory Visit (HOSPITAL_COMMUNITY): Payer: Medicare HMO | Admitting: Physical Therapy

## 2014-04-22 ENCOUNTER — Ambulatory Visit (HOSPITAL_COMMUNITY): Payer: Medicare HMO | Admitting: *Deleted

## 2014-04-23 ENCOUNTER — Encounter: Payer: Self-pay | Admitting: Orthopedic Surgery

## 2014-04-23 ENCOUNTER — Ambulatory Visit (INDEPENDENT_AMBULATORY_CARE_PROVIDER_SITE_OTHER): Payer: Self-pay | Admitting: Orthopedic Surgery

## 2014-04-23 VITALS — BP 111/67 | Ht 71.0 in | Wt 279.0 lb

## 2014-04-23 DIAGNOSIS — M1710 Unilateral primary osteoarthritis, unspecified knee: Secondary | ICD-10-CM

## 2014-04-23 DIAGNOSIS — IMO0002 Reserved for concepts with insufficient information to code with codable children: Secondary | ICD-10-CM

## 2014-04-23 DIAGNOSIS — Z9889 Other specified postprocedural states: Secondary | ICD-10-CM

## 2014-04-23 DIAGNOSIS — M23329 Other meniscus derangements, posterior horn of medial meniscus, unspecified knee: Secondary | ICD-10-CM

## 2014-04-23 DIAGNOSIS — M179 Osteoarthritis of knee, unspecified: Secondary | ICD-10-CM

## 2014-04-23 NOTE — Progress Notes (Signed)
Patient ID: Joel CrosbySteve A Arboleda, male   DOB: April 24, 1951, 63 y.o.   MRN: [id number] Chief Complaint  Patient presents with  . Follow-up    Post op #2, SALK, left knee. DOS 03-20-14.    The patient is progressing well in therapy did have some increased lower back pain with radicular symptoms after some vigorous exercises. I told him to stop therapy. His knee motion is good his knee swelling is decreasing slightly noted over the medial lateral portal however. Pain anterior knee. I showed him some knee exercises to do and then I think he may indeed need a medically necessary brace for his left knee to unload his medial compartment  I will call at the appropriate time if we decide to do that'll give him a couple more weeks before we decide.

## 2014-04-23 NOTE — Patient Instructions (Signed)
Home exercises  

## 2014-04-24 ENCOUNTER — Ambulatory Visit (HOSPITAL_COMMUNITY): Payer: Medicare HMO | Admitting: Physical Therapy

## 2014-04-26 ENCOUNTER — Ambulatory Visit (HOSPITAL_COMMUNITY): Payer: Medicare HMO | Admitting: Physical Therapy

## 2014-05-09 ENCOUNTER — Telehealth: Payer: Self-pay | Admitting: Orthopedic Surgery

## 2014-05-13 NOTE — Telephone Encounter (Signed)
Patient scheduled with Joni ReiningNicole, brace rep with [facility], 05/14/14.

## 2014-08-13 NOTE — Addendum Note (Signed)
Encounter addended by: Doyne Keelash R Katara Griner, PT on: 08/13/2014  8:33 AM<BR>     Documentation filed: Episodes, Letters, Notes Section

## 2014-08-13 NOTE — Progress Notes (Signed)
Discharge Patient Details  Name: Joel CrosbySteve A Ritter MRN: [id number] Date of Birth: Oct 18, 1951  Today's Date: 08/13/2014  Patient discharge due to lack of attendance. Last visit 04/12/14  Jerilee FieldeWitt, Zaeem Kandel R 08/13/2014, 8:32 AM

## 2015-05-01 IMAGING — CR DG CHEST 2V
2 series · 2 of 2 positions shown · non-contrast
Comparison: None.

CLINICAL DATA: Cough, weakness and flu-like symptoms for 2 days.

EXAM:
CHEST  2 VIEW

[view not recorded (1 of 2)]
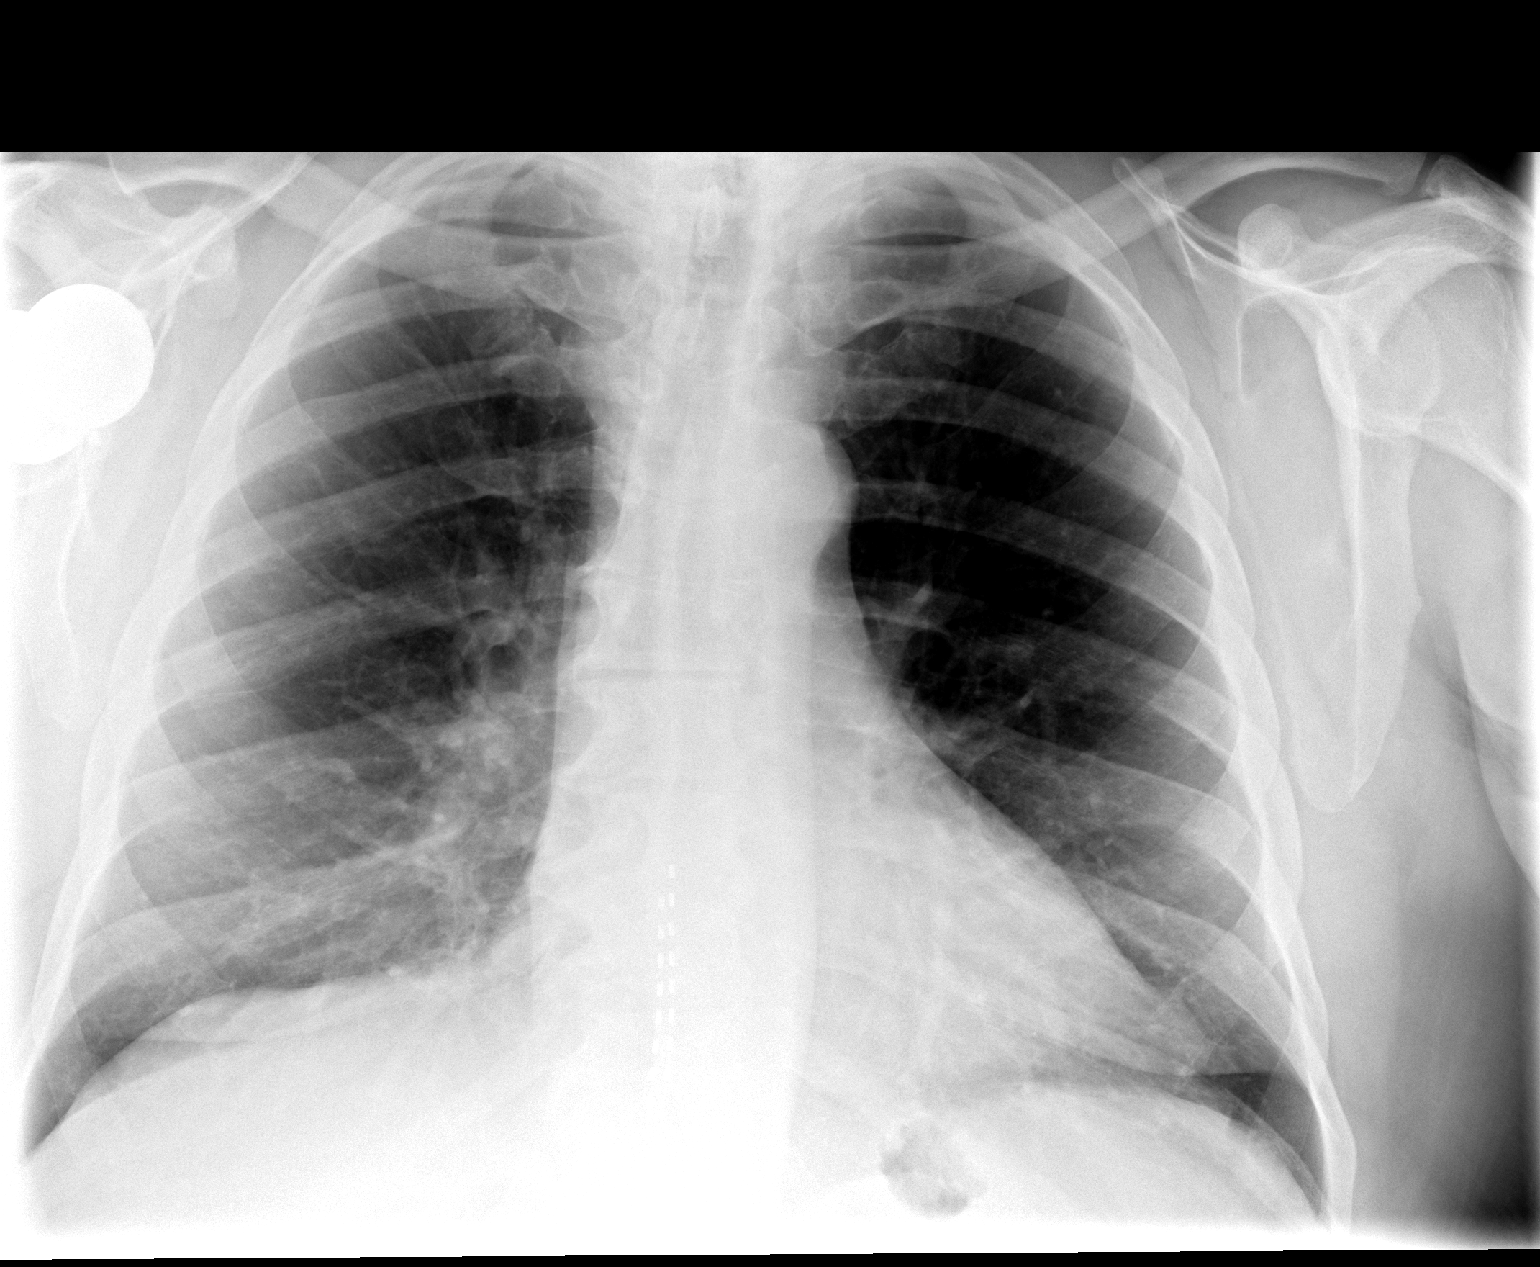

[view not recorded (2 of 2)]
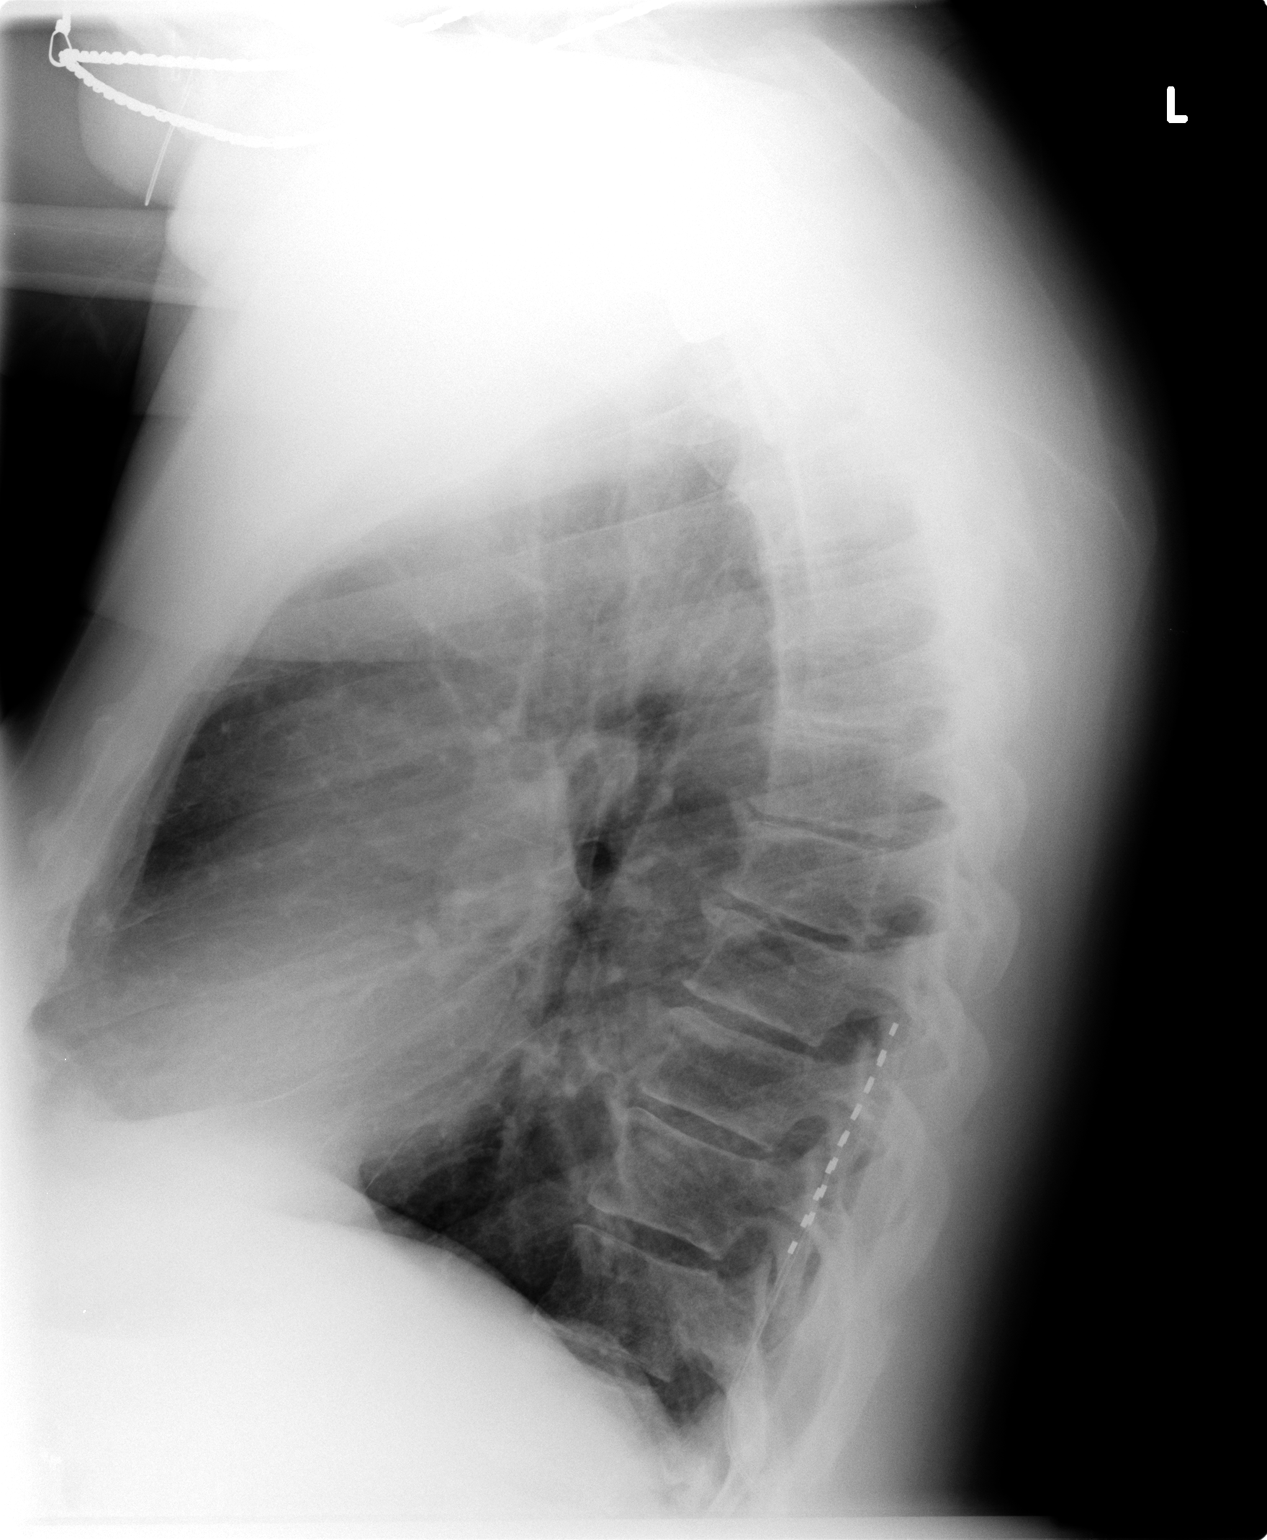

[2 of 2 positions shown; findings below may reference images not displayed]

FINDINGS: Spinal cord stimulator leads are partially visualized, terminating
at the T9-10 level. The cardiomediastinal silhouette is within
normal limits. The lungs are well inflated and clear. No pleural
effusion or pneumothorax is identified. Mild multilevel
osteophytosis is present throughout the thoracic spine. No acute
osseous abnormality is identified.
IMPRESSION: No evidence of acute airspace disease.
